# Patient Record
Sex: Female | Born: 1990 | Hispanic: No | Marital: Married | Smoking: Current every day smoker
Health system: Southern US, Community
[De-identification: ages and names within clinical notes are randomized; demographics above are authoritative.]

## PROBLEM LIST (undated history)

## (undated) DIAGNOSIS — F419 Anxiety disorder, unspecified: Secondary | ICD-10-CM

## (undated) DIAGNOSIS — F1210 Cannabis abuse, uncomplicated: Secondary | ICD-10-CM

## (undated) HISTORY — PX: NO PAST SURGERIES: SHX2092

---
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
## 2008-05-10 ENCOUNTER — Emergency Department (HOSPITAL_COMMUNITY): Admission: EM | Admit: 2008-05-10 | Discharge: 2008-05-10 | Payer: Self-pay | Admitting: Emergency Medicine

## 2008-05-12 ENCOUNTER — Emergency Department (HOSPITAL_COMMUNITY): Admission: EM | Admit: 2008-05-12 | Discharge: 2008-05-12 | Payer: Self-pay | Admitting: Emergency Medicine

## 2009-11-25 ENCOUNTER — Emergency Department: Payer: Self-pay | Admitting: Emergency Medicine

## 2010-04-13 ENCOUNTER — Emergency Department: Payer: Self-pay | Admitting: Emergency Medicine

## 2010-06-12 LAB — COMPREHENSIVE METABOLIC PANEL
ALT: 12 U/L (ref 0–35)
ALT: 13 U/L (ref 0–35)
AST: 19 U/L (ref 0–37)
AST: 20 U/L (ref 0–37)
Albumin: 4.4 g/dL (ref 3.5–5.2)
Albumin: 4.5 g/dL (ref 3.5–5.2)
Alkaline Phosphatase: 69 U/L (ref 39–117)
Alkaline Phosphatase: 77 U/L (ref 39–117)
BUN: 6 mg/dL (ref 6–23)
BUN: 6 mg/dL (ref 6–23)
CO2: 23 mEq/L (ref 19–32)
CO2: 25 mEq/L (ref 19–32)
Calcium: 9.4 mg/dL (ref 8.4–10.5)
Calcium: 9.5 mg/dL (ref 8.4–10.5)
Chloride: 104 mEq/L (ref 96–112)
Chloride: 105 mEq/L (ref 96–112)
Creatinine, Ser: 0.75 mg/dL (ref 0.4–1.2)
Creatinine, Ser: 0.79 mg/dL (ref 0.4–1.2)
GFR calc Af Amer: >60 mL/min (ref >60)
GFR calc Af Amer: >60 mL/min (ref >60)
GFR calc non Af Amer: >60 mL/min (ref >60)
GFR calc non Af Amer: >60 mL/min (ref >60)
Glucose, Bld: 96 mg/dL (ref 70–99)
Glucose, Bld: 99 mg/dL (ref 70–99)
Potassium: 3.9 mEq/L (ref 3.5–5.1)
Potassium: 3.9 mEq/L (ref 3.5–5.1)
Sodium: 137 mEq/L (ref 135–145)
Sodium: 138 mEq/L (ref 135–145)
Total Bilirubin: 0.7 mg/dL (ref 0.3–1.2)
Total Bilirubin: 0.7 mg/dL (ref 0.3–1.2)
Total Protein: 6.9 g/dL (ref 6.0–8.3)
Total Protein: 7.2 g/dL (ref 6.0–8.3)

## 2010-06-12 LAB — CBC
HCT: 40.5 % (ref 36.0–46.0)
HCT: 42 % (ref 36.0–46.0)
Hemoglobin: 13.6 g/dL (ref 12.0–15.0)
Hemoglobin: 14.1 g/dL (ref 12.0–15.0)
MCHC: 33.5 g/dL (ref 30.0–36.0)
MCHC: 33.6 g/dL (ref 30.0–36.0)
MCV: 90.9 fL (ref 78.0–100.0)
MCV: 91.2 fL (ref 78.0–100.0)
Platelets: 294 10*3/uL (ref 150–400)
Platelets: 315 10*3/uL (ref 150–400)
RBC: 4.45 MIL/uL (ref 3.87–5.11)
RBC: 4.6 MIL/uL (ref 3.87–5.11)
RDW: 13.3 % (ref 11.5–15.5)
RDW: 13.4 % (ref 11.5–15.5)
WBC: 8 10*3/uL (ref 4.0–10.5)
WBC: 9.2 10*3/uL (ref 4.0–10.5)

## 2010-06-12 LAB — URINALYSIS, ROUTINE W REFLEX MICROSCOPIC
Bilirubin Urine: NEGATIVE
Bilirubin Urine: NEGATIVE
Glucose, UA: NEGATIVE mg/dL
Glucose, UA: NEGATIVE mg/dL
Hgb urine dipstick: NEGATIVE
Hgb urine dipstick: NEGATIVE
Ketones, ur: 40 mg/dL — AB
Ketones, ur: NEGATIVE mg/dL
Nitrite: NEGATIVE
Nitrite: NEGATIVE
Protein, ur: NEGATIVE mg/dL
Protein, ur: NEGATIVE mg/dL
Specific Gravity, Urine: 1.012 (ref 1.005–1.030)
Specific Gravity, Urine: 1.021 (ref 1.005–1.030)
Urobilinogen, UA: 0.2 mg/dL (ref 0.0–1.0)
Urobilinogen, UA: 1 mg/dL (ref 0.0–1.0)
pH: 6 (ref 5.0–8.0)
pH: 7.5 (ref 5.0–8.0)

## 2010-06-12 LAB — DIFFERENTIAL
Basophils Absolute: 0 10*3/uL (ref 0.0–0.1)
Basophils Absolute: 0.1 10*3/uL (ref 0.0–0.1)
Basophils Relative: 0 % (ref 0–1)
Basophils Relative: 1 % (ref 0–1)
Eosinophils Absolute: 0 10*3/uL (ref 0.0–0.7)
Eosinophils Absolute: 0 10*3/uL (ref 0.0–0.7)
Eosinophils Relative: 0 % (ref 0–5)
Eosinophils Relative: 1 % (ref 0–5)
Lymphocytes Relative: 16 % (ref 12–46)
Lymphocytes Relative: 19 % (ref 12–46)
Lymphs Abs: 1.4 10*3/uL (ref 0.7–4.0)
Lymphs Abs: 1.5 10*3/uL (ref 0.7–4.0)
Monocytes Absolute: 0.3 10*3/uL (ref 0.1–1.0)
Monocytes Absolute: 0.3 10*3/uL (ref 0.1–1.0)
Monocytes Relative: 4 % (ref 3–12)
Monocytes Relative: 4 % (ref 3–12)
Neutro Abs: 6.1 10*3/uL (ref 1.7–7.7)
Neutro Abs: 7.4 10*3/uL (ref 1.7–7.7)
Neutrophils Relative %: 76 % (ref 43–77)
Neutrophils Relative %: 80 % — ABNORMAL HIGH (ref 43–77)

## 2010-06-12 LAB — URINE MICROSCOPIC-ADD ON

## 2010-06-12 LAB — PREGNANCY, URINE: Preg Test, Ur: NEGATIVE

## 2010-06-12 LAB — POCT PREGNANCY, URINE: Preg Test, Ur: NEGATIVE

## 2010-06-12 LAB — LIPASE, BLOOD: Lipase: 15 U/L (ref 11–59)

## 2011-02-28 ENCOUNTER — Encounter: Payer: Self-pay | Admitting: *Deleted

## 2011-02-28 ENCOUNTER — Emergency Department (HOSPITAL_COMMUNITY)
Admission: EM | Admit: 2011-02-28 | Discharge: 2011-03-01 | Disposition: A | Discharge status: Home / Self Care | Attending: Emergency Medicine | Admitting: Emergency Medicine

## 2011-02-28 DIAGNOSIS — F17200 Nicotine dependence, unspecified, uncomplicated: Secondary | ICD-10-CM | POA: Insufficient documentation

## 2011-02-28 DIAGNOSIS — R1033 Periumbilical pain: Secondary | ICD-10-CM | POA: Insufficient documentation

## 2011-02-28 DIAGNOSIS — R197 Diarrhea, unspecified: Secondary | ICD-10-CM | POA: Insufficient documentation

## 2011-02-28 DIAGNOSIS — R112 Nausea with vomiting, unspecified: Secondary | ICD-10-CM | POA: Insufficient documentation

## 2011-02-28 LAB — CBC
HCT: 36.4 % (ref 36.0–46.0)
Hemoglobin: 12.4 g/dL (ref 12.0–15.0)
MCH: 30.5 pg (ref 26.0–34.0)
MCHC: 34.1 g/dL (ref 30.0–36.0)
MCV: 89.4 fL (ref 78.0–100.0)
Platelets: 204 10*3/uL (ref 150–400)
RBC: 4.07 MIL/uL (ref 3.87–5.11)
RDW: 13.4 % (ref 11.5–15.5)
WBC: 6.6 10*3/uL (ref 4.0–10.5)

## 2011-02-28 LAB — DIFFERENTIAL
Basophils Relative: 0 % (ref 0–1)
Eosinophils Absolute: 0 10*3/uL (ref 0.0–0.7)
Eosinophils Relative: 0 % (ref 0–5)
Lymphocytes Relative: 11 % — ABNORMAL LOW (ref 12–46)
Lymphs Abs: 0.8 10*3/uL (ref 0.7–4.0)
Monocytes Absolute: 0.4 10*3/uL (ref 0.1–1.0)
Monocytes Relative: 6 % (ref 3–12)
Neutro Abs: 5.4 10*3/uL (ref 1.7–7.7)
Neutrophils Relative %: 82 % — ABNORMAL HIGH (ref 43–77)

## 2011-02-28 MED ORDER — MORPHINE SULFATE 4 MG/ML IJ SOLN
4.0000 mg | Freq: Once | INTRAMUSCULAR | Status: AC
  Administered 2011-02-28: 4 mg via INTRAVENOUS
  Filled 2011-02-28: qty 1

## 2011-02-28 MED ORDER — SODIUM CHLORIDE 0.9 % IV BOLUS (SEPSIS)
1000.0000 mL | Freq: Once | INTRAVENOUS | Status: AC
  Administered 2011-02-28: 1000 mL via INTRAVENOUS

## 2011-02-28 MED ORDER — ONDANSETRON HCL 4 MG/2ML IJ SOLN
4.0000 mg | Freq: Once | INTRAMUSCULAR | Status: AC
  Administered 2011-02-28: 4 mg via INTRAVENOUS
  Filled 2011-02-28: qty 2

## 2011-02-28 NOTE — ED Provider Notes (Signed)
History    this is a 20 year old female presenting to the ED with chief complaint of abdominal pain. Since last night patient has been having pain to the periumbilical region. Patient describes pain as sharp, shooting, throbbing, waxing and waning with associated nausea vomiting and diarrhea. Pain is rated as a 10 out of 10. She denies fever but having chills, or loss of appetite. Patient does not recall her last menstrual. She has been using implanon. She has implanon removed yesterday.  She denies dysuria or blood per urine.    CSN: [id number]  Arrival date & time 02/28/11  2217   First MD Initiated Contact with Patient 02/28/11 2249      Chief Complaint  Patient presents with  . Abdominal Pain    (Consider location/radiation/quality/duration/timing/severity/associated sxs/prior treatment) HPI  History reviewed. No pertinent past medical history.  History reviewed. No pertinent past surgical history.  History reviewed. No pertinent family history.  History  Substance Use Topics  . Smoking status: Current Everyday Smoker  . Smokeless tobacco: Not on file  . Alcohol Use: Yes    OB History    Grav Para Term Preterm Abortions TAB SAB Ect Mult Living                  Review of Systems  All other systems reviewed and are negative.    Allergies  Review of patient's allergies indicates no known allergies.  Home Medications   Current Outpatient Rx  Name Route Sig Dispense Refill  . CLONAZEPAM 0.5 MG PO TABS Oral Take 0.5 mg by mouth 2 (two) times daily as needed. For anxiety       BP 128/78  Pulse 100  Temp(Src) 98.4 F (36.9 C) (Oral)  Resp 18  SpO2 96%  LMP 02/25/2011  Physical Exam  Nursing note and vitals reviewed. Constitutional: She appears well-developed and well-nourished. No distress.  HENT:  Head: Normocephalic and atraumatic.  Eyes: Conjunctivae are normal.  Neck: Normal range of motion. Neck supple.  Cardiovascular: Normal rate and regular  rhythm.   Pulmonary/Chest: Effort normal and breath sounds normal. She exhibits no tenderness.  Abdominal: Soft. There is tenderness in the periumbilical area. There is no rigidity, no guarding, no CVA tenderness, no tenderness at McBurney's point and negative Murphy's sign. No hernia. Hernia confirmed negative in the ventral area, confirmed negative in the right inguinal area and confirmed negative in the left inguinal area.  Genitourinary: Uterus normal. There is no rash or lesion on the right labia. There is no rash or lesion on the left labia. Cervix exhibits no motion tenderness and no discharge. Right adnexum displays no mass and no tenderness. Left adnexum displays no mass and no tenderness. There is bleeding around the vagina. No erythema or tenderness around the vagina. No vaginal discharge found.  Lymphadenopathy:       Right: No inguinal adenopathy present.       Left: No inguinal adenopathy present.    ED Course  Procedures (including critical care time)  Labs Reviewed - No data to display No results found.   No diagnosis found.    MDM  Periumbilical Abdominal pain with nausea vomiting and diarrhea. Patient also complaining of loss of appetite. With her presenting symptoms I will obtain an abdominal CT scan to rule out appendicitis. However she may likely having flulike symptoms. A pelvic examination was unremarkable. Dr. Graciela Husbands has agreed to continue monitoring patient and will discharge patient as appropriate.  Medical screening examination/treatment/procedure(s) were performed by  non-physician practitioner and as supervising physician I was immediately available for consultation/collaboration. Osvaldo Human, M.D.        Fayrene Helper, PA 03/01/11 4098  Carleene Cooper III, MD 03/01/11 [phone]

## 2011-02-28 NOTE — ED Notes (Signed)
Generalized abd pain tonight with vomiting and diarrhea.  lmp one week ago

## 2011-03-01 ENCOUNTER — Emergency Department (HOSPITAL_COMMUNITY)

## 2011-03-01 ENCOUNTER — Encounter (HOSPITAL_COMMUNITY): Payer: Self-pay | Admitting: Radiology

## 2011-03-01 LAB — POCT I-STAT, CHEM 8
BUN: 9 mg/dL (ref 6–23)
Chloride: 107 mEq/L (ref 96–112)
Potassium: 3.7 mEq/L (ref 3.5–5.1)
Sodium: 140 mEq/L (ref 135–145)

## 2011-03-01 LAB — URINALYSIS, ROUTINE W REFLEX MICROSCOPIC
Ketones, ur: 15 mg/dL — AB
Leukocytes, UA: NEGATIVE
Nitrite: NEGATIVE
Protein, ur: NEGATIVE mg/dL
pH: 5.5 (ref 5.0–8.0)

## 2011-03-01 LAB — HEPATIC FUNCTION PANEL
Alkaline Phosphatase: 51 U/L (ref 39–117)
Total Bilirubin: 0.2 mg/dL — ABNORMAL LOW (ref 0.3–1.2)
Total Protein: 6.3 g/dL (ref 6.0–8.3)

## 2011-03-01 LAB — PREGNANCY, URINE: Preg Test, Ur: NEGATIVE

## 2011-03-01 MED ORDER — MORPHINE SULFATE 4 MG/ML IJ SOLN
4.0000 mg | Freq: Once | INTRAMUSCULAR | Status: AC
  Administered 2011-03-01: 4 mg via INTRAVENOUS
  Filled 2011-03-01: qty 1

## 2011-03-01 MED ORDER — IOHEXOL 300 MG/ML  SOLN
80.0000 mL | Freq: Once | INTRAMUSCULAR | Status: AC | PRN
  Administered 2011-03-01: 80 mL via INTRAVENOUS

## 2011-03-01 MED ORDER — ONDANSETRON HCL 4 MG/2ML IJ SOLN
4.0000 mg | Freq: Once | INTRAMUSCULAR | Status: AC
  Administered 2011-03-01: 4 mg via INTRAVENOUS

## 2011-03-01 MED ORDER — ONDANSETRON HCL 4 MG/2ML IJ SOLN
INTRAMUSCULAR | Status: AC
  Administered 2011-03-01: 4 mg via INTRAVENOUS
  Filled 2011-03-01: qty 2

## 2011-03-01 MED ORDER — IOHEXOL 300 MG/ML  SOLN: 20.0000 mL | Freq: Once | INTRAMUSCULAR | Status: AC | PRN

## 2011-03-01 MED ORDER — ONDANSETRON 8 MG PO TBDP: 8.0000 mg | ORAL_TABLET | Freq: Three times a day (TID) | ORAL | Status: AC | PRN

## 2011-03-01 MED ORDER — OXYCODONE-ACETAMINOPHEN 5-325 MG PO TABS: 1.0000 | ORAL_TABLET | ORAL | Status: AC | PRN

## 2011-03-01 NOTE — ED Provider Notes (Signed)
Ct negative Labs reviewed abd soft and no tenderness in RLQ  Doubt occult appendicitis or other acute abd process Stable for d/c BP 116/59  Pulse 78  Temp(Src) 99.2 F (37.3 C) (Oral)  Resp 16  SpO2 98%  LMP 02/25/2011   Joya Gaskins, MD 03/01/11 [phone]

## 2011-03-02 ENCOUNTER — Encounter (HOSPITAL_COMMUNITY): Payer: Self-pay | Admitting: Emergency Medicine

## 2011-05-08 ENCOUNTER — Encounter (HOSPITAL_COMMUNITY): Payer: Self-pay | Admitting: *Deleted

## 2011-05-08 ENCOUNTER — Emergency Department (HOSPITAL_COMMUNITY)
Admission: EM | Admit: 2011-05-08 | Discharge: 2011-05-08 | Disposition: A | Discharge status: Home / Self Care | Attending: Emergency Medicine | Admitting: Emergency Medicine

## 2011-05-08 DIAGNOSIS — R51 Headache: Secondary | ICD-10-CM | POA: Insufficient documentation

## 2011-05-08 DIAGNOSIS — R112 Nausea with vomiting, unspecified: Secondary | ICD-10-CM | POA: Insufficient documentation

## 2011-05-08 DIAGNOSIS — R1110 Vomiting, unspecified: Secondary | ICD-10-CM

## 2011-05-08 DIAGNOSIS — F17200 Nicotine dependence, unspecified, uncomplicated: Secondary | ICD-10-CM | POA: Insufficient documentation

## 2011-05-08 LAB — POCT I-STAT, CHEM 8
BUN: 11 mg/dL (ref 6–23)
Chloride: 108 mEq/L (ref 96–112)
Sodium: 141 mEq/L (ref 135–145)

## 2011-05-08 LAB — HCG, SERUM, QUALITATIVE: Preg, Serum: NEGATIVE

## 2011-05-08 MED ORDER — ONDANSETRON HCL 4 MG PO TABS: 4.0000 mg | ORAL_TABLET | Freq: Four times a day (QID) | ORAL | Status: AC

## 2011-05-08 MED ORDER — SODIUM CHLORIDE 0.9 % IV BOLUS (SEPSIS)
1000.0000 mL | Freq: Once | INTRAVENOUS | Status: AC
  Administered 2011-05-08: 1000 mL via INTRAVENOUS

## 2011-05-08 MED ORDER — LORAZEPAM 2 MG/ML IJ SOLN
1.0000 mg | Freq: Once | INTRAMUSCULAR | Status: AC
  Administered 2011-05-08: 1 mg via INTRAVENOUS
  Filled 2011-05-08: qty 1

## 2011-05-08 MED ORDER — ONDANSETRON HCL 4 MG/2ML IJ SOLN
4.0000 mg | Freq: Once | INTRAMUSCULAR | Status: AC
  Administered 2011-05-08: 4 mg via INTRAVENOUS
  Filled 2011-05-08: qty 2

## 2011-05-08 MED ORDER — DROPERIDOL 2.5 MG/ML IJ SOLN
2.5000 mg | Freq: Once | INTRAMUSCULAR | Status: AC
  Administered 2011-05-08: 2.5 mg via INTRAVENOUS
  Filled 2011-05-08: qty 1

## 2011-05-08 NOTE — ED Provider Notes (Signed)
I saw and evaluated the patient, reviewed the resident's note and I agree with the findings and plan.   .Face to face Exam:  General:  Awake HEENT:  Atraumatic Resp:  Normal effort Abd:  Nondistended Neuro:No focal weakness Lymph: No adenopathy   Nelia Shi, MD 05/08/11 1840

## 2011-05-08 NOTE — ED Provider Notes (Signed)
Care assumed from Dr. Radford Pax at shift change.  Patient feeling better after ivf's.  Labs look okay.  Will discharge with pain meds.   Geoffery Lyons, MD 05/08/11 [phone]
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
## 2011-05-08 NOTE — ED Provider Notes (Signed)
History     CSN: [id number]  Arrival date & time 05/08/11  1252   First MD Initiated Contact with Patient 05/08/11 1333      Chief Complaint  Patient presents with  . Nausea  . Emesis    (Consider location/radiation/quality/duration/timing/severity/associated sxs/prior Treatment)  HPI Patient is 21 yo lady without significant PMH, who presents with nausea and vomiting. Per patient, she drank proximately 6 shots of liquor alcohol in yesterday evening and took small amount of cocaine (about 0.25 gram per patient). After that she could not go to sleep.  Her friend give her a Subsoxone strip to help her go to sleep. She took one quarter of the Suboxone strip sublingually. 2 hours later, she started having severe nausea and vomiting. She vomited more than 15 times since last night. There is no blood in the vomitus. She also has mild headache, hand shaking, itch on her arms and face. She denies fever, chills, abdominal pain, diarrhea, constipation.   She reports that she has not been sexually active for past 4 months. Her last menstrual period was in the mid of last month.    Denies cough, chest pain, SOB, dysuria, urgency, frequency, hematuria, joint pain or leg swelling.  History  Substance Use Topics  . Smoking status: Current Everyday Smoker  . Smokeless tobacco: Not on file  . Alcohol Use: Yes    OB History    Grav Para Term Preterm Abortions TAB SAB Ect Mult Living                  Review of Systems  Constitutional: Negative for fever, chills and diaphoresis.  HENT: Negative for ear pain, congestion, sore throat, facial swelling, sneezing, mouth sores, trouble swallowing, neck pain, tinnitus and ear discharge.   Eyes: Negative for photophobia, pain, discharge and visual disturbance.  Respiratory: Negative for cough, choking, chest tightness, shortness of breath, wheezing and stridor.   Cardiovascular: Negative for chest pain, palpitations and leg swelling.  Gastrointestinal:  Positive for nausea and vomiting. Negative for abdominal pain, diarrhea, constipation, blood in stool, abdominal distention, anal bleeding and rectal pain.  Genitourinary: Negative for dysuria, urgency, frequency, hematuria, decreased urine volume, vaginal bleeding, vaginal discharge and menstrual problem.  Musculoskeletal: Negative for myalgias, back pain, joint swelling and gait problem.  Skin: Negative for rash and wound.  Neurological: Positive for headaches. Negative for dizziness, seizures, syncope, facial asymmetry, speech difficulty, weakness, light-headedness and numbness.       Patient has hand shaking.   Hematological: Does not bruise/bleed easily.  Psychiatric/Behavioral: Negative for hallucinations, confusion and agitation.    Allergies  Review of patient's allergies indicates no known allergies.  Home Medications   Current Outpatient Rx  Name Route Sig Dispense Refill  . CLONAZEPAM 0.5 MG PO TABS Oral Take 0.5 mg by mouth 2 (two) times daily as needed. For anxiety       BP 137/75  Pulse 70  Temp(Src) 97.6 F (36.4 C) (Oral)  Resp 20  SpO2 96%  Physical Exam  General: not in acute distress HEENT: PERRL, EOMI, no scleral icterus Cardiac: S1/S2, RRR, No murmurs, gallops or rubs Pulm: Good air movement bilaterally, Clear to auscultation bilaterally, No rales, wheezing, rhonchi or rubs. Abd: Soft,  nondistended, nontender, no rebound pain, no organomegaly, BS present Ext: No rashes or edema, 2+DP/PT pulse bilaterally. Has fine tremor in hands bilaterally. Neuro: alert and oriented X3, cranial nerves II-XII grossly intact, muscle strength 5/5 in all extremeties,  sensation to light touch  intact.   ED Course  Procedures (including critical care time)  IV fluid, NL 1000 ml Zofran Stat Chem 8 hCG, serum, qualitative Ativan   Labs Reviewed  HCG, SERUM, QUALITATIVE   No results found.   No diagnosis found.    MDM          Lorretta Harp, MD 05/08/11  [phone]

## 2011-05-08 NOTE — Discharge Instructions (Signed)
Nausea and Vomiting  Nausea is a sick feeling that often comes before throwing up (vomiting). Vomiting is a reflex where stomach contents come out of your mouth. Vomiting can cause severe loss of body fluids (dehydration). Children and elderly adults can become dehydrated quickly, especially if they also have diarrhea. Nausea and vomiting are symptoms of a condition or disease. It is important to find the cause of your symptoms.  CAUSES    Direct irritation of the stomach lining. This irritation can result from increased acid production (gastroesophageal reflux disease), infection, food poisoning, taking certain medicines (such as nonsteroidal anti-inflammatory drugs), alcohol use, or tobacco use.   Signals from the brain.These signals could be caused by a headache, heat exposure, an inner ear disturbance, increased pressure in the brain from injury, infection, a tumor, or a concussion, pain, emotional stimulus, or metabolic problems.   An obstruction in the gastrointestinal tract (bowel obstruction).   Illnesses such as diabetes, hepatitis, gallbladder problems, appendicitis, kidney problems, cancer, sepsis, atypical symptoms of a heart attack, or eating disorders.   Medical treatments such as chemotherapy and radiation.   Receiving medicine that makes you sleep (general anesthetic) during surgery.  DIAGNOSIS  Your caregiver may ask for tests to be done if the problems do not improve after a few days. Tests may also be done if symptoms are severe or if the reason for the nausea and vomiting is not clear. Tests may include:   Urine tests.   Blood tests.   Stool tests.   Cultures (to look for evidence of infection).   X-rays or other imaging studies.  Test results can help your caregiver make decisions about treatment or the need for additional tests.  TREATMENT  You need to stay well hydrated. Drink frequently but in small amounts.You may wish to drink water, sports drinks, clear broth, or eat frozen  ice pops or gelatin dessert to help stay hydrated.When you eat, eating slowly may help prevent nausea.There are also some antinausea medicines that may help prevent nausea.  HOME CARE INSTRUCTIONS    Take all medicine as directed by your caregiver.   If you do not have an appetite, do not force yourself to eat. However, you must continue to drink fluids.   If you have an appetite, eat a normal diet unless your caregiver tells you differently.   Eat a variety of complex carbohydrates (rice, wheat, potatoes, bread), lean meats, yogurt, fruits, and vegetables.   Avoid high-fat foods because they are more difficult to digest.   Drink enough water and fluids to keep your urine clear or pale yellow.   If you are dehydrated, ask your caregiver for specific rehydration instructions. Signs of dehydration may include:   Severe thirst.   Dry lips and mouth.   Dizziness.   Dark urine.   Decreasing urine frequency and amount.   Confusion.   Rapid breathing or pulse.  SEEK IMMEDIATE MEDICAL CARE IF:    You have blood or brown flecks (like coffee grounds) in your vomit.   You have black or bloody stools.   You have a severe headache or stiff neck.   You are confused.   You have severe abdominal pain.   You have chest pain or trouble breathing.   You do not urinate at least once every 8 hours.   You develop cold or clammy skin.   You continue to vomit for longer than 24 to 48 hours.   You have a fever.  MAKE SURE YOU:      Understand these instructions.   Will watch your condition.   Will get help right away if you are not doing well or get worse.  Document Released: 02/16/2005 Document Revised: 02/05/2011 Document Reviewed: 07/16/2010  [employer] Patient Information 2012 [employer].

## 2011-07-11 ENCOUNTER — Emergency Department (HOSPITAL_COMMUNITY)

## 2011-07-11 ENCOUNTER — Emergency Department (HOSPITAL_COMMUNITY)
Admission: EM | Admit: 2011-07-11 | Discharge: 2011-07-11 | Disposition: A | Discharge status: Home / Self Care | Attending: Emergency Medicine | Admitting: Emergency Medicine

## 2011-07-11 DIAGNOSIS — R079 Chest pain, unspecified: Secondary | ICD-10-CM | POA: Insufficient documentation

## 2011-07-11 DIAGNOSIS — M545 Low back pain, unspecified: Secondary | ICD-10-CM | POA: Insufficient documentation

## 2011-07-11 DIAGNOSIS — IMO0002 Reserved for concepts with insufficient information to code with codable children: Secondary | ICD-10-CM

## 2011-07-11 DIAGNOSIS — R0602 Shortness of breath: Secondary | ICD-10-CM | POA: Insufficient documentation

## 2011-07-11 DIAGNOSIS — M25559 Pain in unspecified hip: Secondary | ICD-10-CM | POA: Insufficient documentation

## 2011-07-11 DIAGNOSIS — R109 Unspecified abdominal pain: Secondary | ICD-10-CM | POA: Insufficient documentation

## 2011-07-11 LAB — URINALYSIS, MICROSCOPIC ONLY
Nitrite: NEGATIVE
Specific Gravity, Urine: 1.019 (ref 1.005–1.030)
pH: 5.5 (ref 5.0–8.0)

## 2011-07-11 LAB — RAPID URINE DRUG SCREEN, HOSP PERFORMED
Amphetamines: NOT DETECTED
Barbiturates: NOT DETECTED
Benzodiazepines: POSITIVE — AB
Cocaine: NOT DETECTED
Opiates: POSITIVE — AB
Tetrahydrocannabinol: POSITIVE — AB

## 2011-07-11 LAB — POCT I-STAT, CHEM 8
BUN: 19 mg/dL (ref 6–23)
Chloride: 105 mEq/L (ref 96–112)
Creatinine, Ser: 0.8 mg/dL (ref 0.50–1.10)
Glucose, Bld: 119 mg/dL — ABNORMAL HIGH (ref 70–99)
HCT: 38 % (ref 36.0–46.0)
Potassium: 3.7 mEq/L (ref 3.5–5.1)

## 2011-07-11 LAB — COMPREHENSIVE METABOLIC PANEL WITH GFR
ALT: 11 U/L (ref 0–35)
AST: 21 U/L (ref 0–37)
Albumin: 3.6 g/dL (ref 3.5–5.2)
Alkaline Phosphatase: 56 U/L (ref 39–117)
BUN: 16 mg/dL (ref 6–23)
CO2: 20 meq/L (ref 19–32)
Calcium: 8.3 mg/dL — ABNORMAL LOW (ref 8.4–10.5)
Chloride: 102 meq/L (ref 96–112)
Creatinine, Ser: 0.76 mg/dL (ref 0.50–1.10)
GFR calc Af Amer: >90 mL/min
GFR calc non Af Amer: >90 mL/min
Glucose, Bld: 116 mg/dL — ABNORMAL HIGH (ref 70–99)
Potassium: 3.3 meq/L — ABNORMAL LOW (ref 3.5–5.1)
Sodium: 135 meq/L (ref 135–145)
Total Bilirubin: 0.1 mg/dL — ABNORMAL LOW (ref 0.3–1.2)
Total Protein: 6.1 g/dL (ref 6.0–8.3)

## 2011-07-11 LAB — CBC
Hemoglobin: 12.7 g/dL (ref 12.0–15.0)
Platelets: 112 10*3/uL — ABNORMAL LOW (ref 150–400)
RBC: 4.05 MIL/uL (ref 3.87–5.11)
WBC: 12.5 10*3/uL — ABNORMAL HIGH (ref 4.0–10.5)

## 2011-07-11 LAB — SAMPLE TO BLOOD BANK

## 2011-07-11 LAB — ETHANOL: Alcohol, Ethyl (B): <11 mg/dL (ref 0–11)

## 2011-07-11 LAB — LACTIC ACID, PLASMA: Lactic Acid, Venous: 1.6 mmol/L (ref 0.5–2.2)

## 2011-07-11 MED ORDER — ONDANSETRON 4 MG PO TBDP: 4.0000 mg | ORAL_TABLET | Freq: Three times a day (TID) | ORAL | Status: AC | PRN

## 2011-07-11 MED ORDER — ONDANSETRON HCL 4 MG/2ML IJ SOLN
INTRAMUSCULAR | Status: AC
  Administered 2011-07-11: 4 mg
  Filled 2011-07-11: qty 2

## 2011-07-11 MED ORDER — OXYCODONE-ACETAMINOPHEN 7.5-325 MG PO TABS: 1.0000 | ORAL_TABLET | ORAL | Status: AC | PRN

## 2011-07-11 MED ORDER — HYDROMORPHONE HCL PF 1 MG/ML IJ SOLN
1.0000 mg | Freq: Once | INTRAMUSCULAR | Status: AC
  Administered 2011-07-11: 1 mg via INTRAVENOUS
  Filled 2011-07-11: qty 1

## 2011-07-11 MED ORDER — TETANUS-DIPHTHERIA TOXOIDS TD 5-2 LFU IM INJ
0.5000 mL | INJECTION | Freq: Once | INTRAMUSCULAR | Status: AC
  Administered 2011-07-11: 0.5 mL via INTRAMUSCULAR
  Filled 2011-07-11: qty 0.5

## 2011-07-11 MED ORDER — MORPHINE SULFATE 4 MG/ML IJ SOLN
INTRAMUSCULAR | Status: AC
  Administered 2011-07-11: 03:00:00
  Filled 2011-07-11: qty 1

## 2011-07-11 MED ORDER — HYDROMORPHONE HCL PF 1 MG/ML IJ SOLN
1.0000 mg | Freq: Once | INTRAMUSCULAR | Status: AC
  Administered 2011-07-11: 1 mg via INTRAVENOUS
  Administered 2011-07-11: 0.5 mg via INTRAVENOUS
  Filled 2011-07-11: qty 1

## 2011-07-11 MED ORDER — HYDROMORPHONE HCL PF 1 MG/ML IJ SOLN
INTRAMUSCULAR | Status: AC
  Administered 2011-07-11: 1 mg via INTRAVENOUS
  Filled 2011-07-11: qty 1

## 2011-07-11 MED ORDER — MORPHINE SULFATE 2 MG/ML IJ SOLN
INTRAMUSCULAR | Status: AC | PRN
  Administered 2011-07-11: 6 mg via INTRAVENOUS
  Administered 2011-07-11 (×2): 2 mg via INTRAVENOUS

## 2011-07-11 MED ORDER — HYDROMORPHONE HCL PF 1 MG/ML IJ SOLN
1.0000 mg | Freq: Once | INTRAMUSCULAR | Status: AC
  Administered 2011-07-11: 0.5 mg via INTRAVENOUS
  Administered 2011-07-11: 1 mg via INTRAVENOUS
  Filled 2011-07-11: qty 1

## 2011-07-11 MED ORDER — CEFAZOLIN SODIUM 1-5 GM-% IV SOLN
1.0000 g | Freq: Once | INTRAVENOUS | Status: AC
  Administered 2011-07-11: 1 g via INTRAVENOUS
  Filled 2011-07-11: qty 50

## 2011-07-11 MED ORDER — HYDROMORPHONE HCL PF 1 MG/ML IJ SOLN
INTRAMUSCULAR | Status: AC
  Administered 2011-07-11: 0.5 mg via INTRAVENOUS
  Filled 2011-07-11: qty 1

## 2011-07-11 MED ORDER — MORPHINE SULFATE 2 MG/ML IJ SOLN
INTRAMUSCULAR | Status: AC
  Administered 2011-07-11: 2 mg via INTRAVENOUS
  Filled 2011-07-11: qty 1

## 2011-07-11 MED ORDER — IOHEXOL 300 MG/ML  SOLN
100.0000 mL | Freq: Once | INTRAMUSCULAR | Status: AC | PRN
  Administered 2011-07-11: 100 mL via INTRAVENOUS

## 2011-07-11 MED ORDER — DIAZEPAM 5 MG/ML IJ SOLN
2.5000 mg | Freq: Once | INTRAMUSCULAR | Status: AC
  Administered 2011-07-11: 2.5 mg via INTRAVENOUS
  Filled 2011-07-11: qty 2

## 2011-07-11 NOTE — ED Notes (Signed)
PT TRANSPORTED TO XRAY 

## 2011-07-11 NOTE — ED Notes (Signed)
PT. RESTING , SUTURE CART SET UP AT BEDSIDE , IV SITE AND FOLEY CATHETER INTACT.

## 2011-07-11 NOTE — ED Notes (Signed)
DR. Norlene Campbell AT BEDSIDE SPEAKING WITH PT. AND FAMILY.
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
## 2011-07-11 NOTE — ED Notes (Signed)
DR. Tollie Eth WITH FATHER AND MOTHER.
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
## 2011-07-11 NOTE — ED Notes (Signed)
PT. CURRENTLY AT CT SCAN . FAMILY ARRIVED - CHAPLAIN WITH FAMILY .

## 2011-07-11 NOTE — ED Notes (Signed)
C-COLLAR REMOVED BY DR. Norlene Campbell.  ICE CHIPS GIVEN TO PT.

## 2011-07-11 NOTE — ED Notes (Signed)
FR.14 FOLEY CATHETER INSERTED BY NT.

## 2011-07-11 NOTE — Discharge Instructions (Signed)
Laceration Care, Adult A laceration is a cut that goes through all layers of the skin. The cut goes into the tissue beneath the skin. HOME CARE For stitches (sutures) or staples:  Keep the cut clean and dry.   If you have a bandage (dressing), change it at least once a day. Change the bandage if it gets wet or dirty, or as told by your doctor.   Wash the cut with soap and water 2 times a day. Rinse the cut with water. Pat it dry with a clean towel.   Put a thin layer of medicated cream on the cut as told by your doctor.   You may shower after the first 24 hours. Do not soak the cut in water until the stitches are removed.   Only take medicines as told by your doctor.   Have your stitches or staples removed as told by your doctor.  For skin adhesive strips:  Keep the cut clean and dry.   Do not get the strips wet. You may take a bath, but be careful to keep the cut dry.   If the cut gets wet, pat it dry with a clean towel.   The strips will fall off on their own. Do not remove the strips that are still stuck to the cut.  For wound glue:  You may shower or take baths. Do not soak or scrub the cut. Do not swim. Avoid heavy sweating until the glue falls off on its own. After a shower or bath, pat the cut dry with a clean towel.   Do not put medicine on your cut until the glue falls off.   If you have a bandage, do not put tape over the glue.   Avoid lots of sunlight or tanning lamps until the glue falls off. Put sunscreen on the cut for the first year to reduce your scar.   The glue will fall off on its own. Do not pick at the glue.  You may need a tetanus shot if:  You cannot remember when you had your last tetanus shot.   You have never had a tetanus shot.  If you need a tetanus shot and you choose not to have one, you may get tetanus. Sickness from tetanus can be serious. GET HELP RIGHT AWAY IF:   Your pain does not get better with medicine.   Your arm, hand, leg, or  foot loses feeling (numbness) or changes color.   Your cut is bleeding.   Your joint feels weak, or you cannot use your joint.   You have painful lumps on your body.   Your cut is red, puffy (swollen), or painful.   You have a red line on the skin near the cut.   You have yellowish-white fluid (pus) coming from the cut.   You have a fever.   You have a bad smell coming from the cut or bandage.   Your cut breaks open before or after stitches are removed.   You notice something coming out of the cut, such as wood or glass.   You cannot move a finger or toe.  MAKE SURE YOU:   Understand these instructions.   Will watch your condition.   Will get help right away if you are not doing well or get worse.  Document Released: 08/05/2007 Document Revised: 02/05/2011 Document Reviewed: 08/12/2010 [employer] Patient Information 2012 [employer], [facility]    Contusion A contusion is a deep bruise. Contusions are the result  of an injury that caused bleeding under the skin. The contusion may turn blue, purple, or yellow. Minor injuries will give you a painless contusion, but more severe contusions may stay painful and swollen for a few weeks.  CAUSES  A contusion is usually caused by a blow, trauma, or direct force to an area of the body. SYMPTOMS   Swelling and redness of the injured area.   Bruising of the injured area.   Tenderness and soreness of the injured area.   Pain.  DIAGNOSIS  The diagnosis can be made by taking a history and physical exam. An X-ray, CT scan, or MRI may be needed to determine if there were any associated injuries, such as fractures. TREATMENT  Specific treatment will depend on what area of the body was injured. In general, the best treatment for a contusion is resting, icing, elevating, and applying cold compresses to the injured area. Over-the-counter medicines may also be recommended for pain control. Ask your caregiver what the best treatment is for your  contusion. HOME CARE INSTRUCTIONS   Put ice on the injured area.   Put ice in a plastic bag.   Place a towel between your skin and the bag.   Leave the ice on for 15 to 20 minutes, 3 to 4 times a day.   Only take over-the-counter or prescription medicines for pain, discomfort, or fever as directed by your caregiver. Your caregiver may recommend avoiding anti-inflammatory medicines (aspirin, ibuprofen, and naproxen) for 48 hours because these medicines may increase bruising.   Rest the injured area.   If possible, elevate the injured area to reduce swelling.  SEEK IMMEDIATE MEDICAL CARE IF:   You have increased bruising or swelling.   You have pain that is getting worse.   Your swelling or pain is not relieved with medicines.  MAKE SURE YOU:   Understand these instructions.   Will watch your condition.   Will get help right away if you are not doing well or get worse.  Document Released: 11/26/2004 Document Revised: 02/05/2011 Document Reviewed: 12/22/2010 [facility] Patient Information 2012 [place], [place].Crutch Use You have been prescribed crutches to take weight off one of your lower legs or feet (extremities). When using crutches, make sure you are not putting pressure on the armpit (axilla). This could cause damage to the nerves that extend from your axilla to the hand and arm. When fitted properly the crutches should be 2 to 3 finger widths below the axilla. Your weight should be supported by your hand, and not by resting upon the crutch with the axilla. When walking, first step with the crutches, then swing the healthy leg through and slightly ahead. When going up stairs, first step up with the healthy leg and then follow with the crutches and injured leg up to the same step, and so forth. If there is a handrail, hold both crutches in one hand, place your other hand on the handrail, and while placing your weight on your arms, lift your good leg to the step, then bring the  crutches and the injured leg up to that step. Repeat for each step. When going down stairs, first step with the injured leg and crutches, following down with the healthy leg to the same step. Be very careful, as going down stairs with crutches is very challenging. If you feel wobbly or nervous, sit down and inch yourself down the stairs on your butt. To get up from a chair, hold injured leg forward, grab armrest  with one hand and the top of the crutches with the other hand. Using these supports, pull yourself up to a standing position. Reverse this procedure for sitting. See your caregiver for follow up as suggested. If you are discharged in an ace wrap and develop numbness, tingling, swelling, or increased pain, loosen the ace wrap and re-wrap looser. If these problems persist, see your caregiver as needed. If you have been instructed to use partial weight bearing, bear (apply) the amount of weight as suggested by your caregiver. Do not bear weight in an amount that causes pain on the area of injury. Document Released: 02/14/2000 Document Revised: 02/05/2011 Document Reviewed: 04/23/2008 [facility] Patient Information 2012 [place], [place].Marland Kitchen

## 2011-07-11 NOTE — ED Provider Notes (Signed)
LACERATION REPAIR Performed by: Nelia Shi Authorized by: Nelia Shi Consent: Verbal consent obtained. Risks and benefits: risks, benefits and alternatives were discussed Consent given by: patient Patient identity confirmed: provided demographic data Prepped and Draped in normal sterile fashion Wound explored  Laceration Location: R hip   Laceration Length: 8cm  No Foreign Bodies seen or palpated  Anesthesia: local infiltration  Local anesthetic: lidocaine 2% with epinephrine  Anesthetic total: 15 ml  Irrigation method: syringe Amount of cleaning:extensive   Skin closure:primary  Number of sutures: 16  Technique: interupted   Patient tolerance: Patient tolerated the procedure well with no immediate complications.   Nelia Shi, MD 07/11/11 1147

## 2011-07-11 NOTE — ED Notes (Signed)
X-ray at bedside

## 2011-07-11 NOTE — ED Notes (Signed)
PT. RECEIVED MORPHINE 6 MG , DUPLICATE DOCUMENTATION BY TRAUMA /SCRIBE NURSE.

## 2011-07-11 NOTE — ED Notes (Signed)
Patient being log-rolled off spine board; C-spine precautions maintained.

## 2011-11-20 ENCOUNTER — Encounter (HOSPITAL_COMMUNITY): Payer: Self-pay | Admitting: Emergency Medicine

## 2011-11-20 ENCOUNTER — Emergency Department (HOSPITAL_COMMUNITY)
Admission: EM | Admit: 2011-11-20 | Discharge: 2011-11-20 | Disposition: A | Discharge status: Home / Self Care | Attending: Emergency Medicine | Admitting: Emergency Medicine

## 2011-11-20 DIAGNOSIS — R599 Enlarged lymph nodes, unspecified: Secondary | ICD-10-CM | POA: Insufficient documentation

## 2011-11-20 DIAGNOSIS — J069 Acute upper respiratory infection, unspecified: Secondary | ICD-10-CM | POA: Insufficient documentation

## 2011-11-20 DIAGNOSIS — K047 Periapical abscess without sinus: Secondary | ICD-10-CM

## 2011-11-20 DIAGNOSIS — K044 Acute apical periodontitis of pulpal origin: Secondary | ICD-10-CM | POA: Insufficient documentation

## 2011-11-20 HISTORY — DX: Anxiety disorder, unspecified: F41.9

## 2011-11-20 LAB — CBC WITH DIFFERENTIAL/PLATELET
Basophils Absolute: 0.1 10*3/uL (ref 0.0–0.1)
Eosinophils Relative: 1 % (ref 0–5)
Lymphs Abs: 1.6 10*3/uL (ref 0.7–4.0)
MCH: 29.8 pg (ref 26.0–34.0)
MCV: 87.8 fL (ref 78.0–100.0)
Monocytes Absolute: 0.6 10*3/uL (ref 0.1–1.0)
Platelets: 271 10*3/uL (ref 150–400)
RDW: 12.2 % (ref 11.5–15.5)

## 2011-11-20 LAB — URINALYSIS, MICROSCOPIC ONLY
Ketones, ur: 15 mg/dL — AB
Nitrite: NEGATIVE
Protein, ur: NEGATIVE mg/dL
Specific Gravity, Urine: 1.025 (ref 1.005–1.030)
Urobilinogen, UA: 1 mg/dL (ref 0.0–1.0)

## 2011-11-20 LAB — COMPREHENSIVE METABOLIC PANEL
AST: 20 U/L (ref 0–37)
BUN: 13 mg/dL (ref 6–23)
CO2: 24 mEq/L (ref 19–32)
Calcium: 9.2 mg/dL (ref 8.4–10.5)
Creatinine, Ser: 0.55 mg/dL (ref 0.50–1.10)
GFR calc non Af Amer: >90 mL/min (ref >90)

## 2011-11-20 LAB — LIPASE, BLOOD: Lipase: 16 U/L (ref 11–59)

## 2011-11-20 LAB — PREGNANCY, URINE: Preg Test, Ur: NEGATIVE

## 2011-11-20 LAB — RAPID STREP SCREEN (MED CTR MEBANE ONLY): Streptococcus, Group A Screen (Direct): NEGATIVE

## 2011-11-20 MED ORDER — ONDANSETRON HCL 4 MG/2ML IJ SOLN
4.0000 mg | Freq: Once | INTRAMUSCULAR | Status: AC
  Administered 2011-11-20: 4 mg via INTRAVENOUS
  Filled 2011-11-20: qty 2

## 2011-11-20 MED ORDER — DEXAMETHASONE 1 MG/ML PO CONC
10.0000 mg | Freq: Once | ORAL | Status: AC
  Administered 2011-11-20: 10 mg via ORAL
  Filled 2011-11-20: qty 10

## 2011-11-20 MED ORDER — AMOXICILLIN-POT CLAVULANATE 500-125 MG PO TABS: 1.0000 | ORAL_TABLET | Freq: Three times a day (TID) | ORAL | Status: DC

## 2011-11-20 MED ORDER — SODIUM CHLORIDE 0.9 % IV SOLN
1000.0000 mL | Freq: Once | INTRAVENOUS | Status: AC
  Administered 2011-11-20: 1000 mL via INTRAVENOUS

## 2011-11-20 MED ORDER — ACETAMINOPHEN 325 MG PO TABS
650.0000 mg | ORAL_TABLET | Freq: Once | ORAL | Status: AC
  Administered 2011-11-20: 650 mg via ORAL
  Filled 2011-11-20: qty 2

## 2011-11-20 MED ORDER — ONDANSETRON HCL 4 MG PO TABS: 4.0000 mg | ORAL_TABLET | Freq: Four times a day (QID) | ORAL | Status: DC

## 2011-11-20 NOTE — ED Notes (Signed)
RN to obtain labs with start of IV 

## 2011-11-20 NOTE — ED Notes (Signed)
Pt states she cannot take the tylenol by mouth because she is feeling nauseated. Dr. Adriana Simas notified.

## 2011-11-20 NOTE — ED Notes (Signed)
Pt presenting to ed with multiple complaints. Pt states positive abdominal pain with positive nausea and vomiting. Pt denies diarrhea pt states she has sore throat and fever. Pt states she also has bodyaches onset x 2-3 days pt states "I have had dry heaves for the past couple of hours"

## 2011-11-20 NOTE — ED Provider Notes (Signed)
History     CSN: [id number]  Arrival date & time 11/20/11  1341   First MD Initiated Contact with Patient 11/20/11 1416      Chief Complaint  Patient presents with  . Abdominal Pain  . Fever  . Sore Throat    (Consider location/radiation/quality/duration/timing/severity/associated sxs/prior treatment) HPI The patient presents with one week of multiple complaints.  She notes that her symptoms began gradually.  Initially she complained of sore throat.  Over the following days she developed generalized discomfort, fever, nausea, anorexia, diffuse abdominal discomfort.  She denies vomiting or diarrhea.  She denies any cough.  She states that she was in her usual state of health prior to the onset of symptoms.  No relief with OTC medications.  No clear exacerbating factors. Past Medical History  Diagnosis Date  . Anxiety     History reviewed. No pertinent past surgical history.  No family history on file.  History  Substance Use Topics  . Smoking status: Current Every Day Smoker -- 1.0 packs/day    Types: Cigarettes  . Smokeless tobacco: Not on file  . Alcohol Use: Yes    OB History    Grav Para Term Preterm Abortions TAB SAB Ect Mult Living                  Review of Systems  Constitutional:       HPI  HENT:       HPI otherwise negative  Eyes: Negative.   Respiratory:       HPI, otherwise negative  Cardiovascular:       HPI, otherwise nmegative  Gastrointestinal: Negative for vomiting.  Genitourinary:       HPI, otherwise negative  Musculoskeletal:       HPI, otherwise negative  Skin: Negative.   Neurological: Negative for syncope.    Allergies  Review of patient's allergies indicates no known allergies.  Home Medications   Current Outpatient Rx  Name Route Sig Dispense Refill  . ACETAMINOPHEN 500 MG PO TABS Oral Take 1,000 mg by mouth every 6 (six) hours as needed. For pain.    Marland Kitchen CLONAZEPAM 0.5 MG PO TABS Oral Take 0.5 mg by mouth 2 (two) times daily  as needed. For anxiety       BP 125/72  Pulse 93  Temp 99 F (37.2 C) (Oral)  Resp 18  SpO2 97%  LMP 11/06/2011  Physical Exam  Nursing note and vitals reviewed. Constitutional: She is oriented to person, place, and time. She appears well-developed and well-nourished. No distress.  HENT:  Head: Normocephalic and atraumatic.  Mouth/Throat: Oropharynx is clear and moist and mucous membranes are normal. No oropharyngeal exudate, posterior oropharyngeal edema, posterior oropharyngeal erythema or tonsillar abscesses.  Eyes: Conjunctivae normal and EOM are normal. Right eye exhibits no discharge. Left eye exhibits no discharge.  Cardiovascular: Normal rate and regular rhythm.   Pulmonary/Chest: Effort normal and breath sounds normal. No stridor. No respiratory distress.  Abdominal: Soft. Normal appearance. She exhibits no distension. There is no tenderness.  Musculoskeletal: She exhibits no edema.  Lymphadenopathy:    She has cervical adenopathy.  Neurological: She is alert and oriented to person, place, and time. No cranial nerve deficit.  Skin: Skin is warm and dry.  Psychiatric: She has a normal mood and affect.    ED Course  Procedures (including critical care time)   Labs Reviewed  CBC WITH DIFFERENTIAL  COMPREHENSIVE METABOLIC PANEL  LIPASE, BLOOD  URINALYSIS, MICROSCOPIC ONLY  RAPID  STREP SCREEN  PREGNANCY, URINE   No results found.   No diagnosis found.  Pulse ox 100% room air normal   MDM  This generally W F p/w multiple complaints. On my exam she was in no distress.  With a reassuring PE, and labs, the patient was appropriate for d/c w close PMD F/U.  Given her pharyngeal complaints, she was started on ABX, though a viral etiology is equally possible.        Gerhard Munch, MD 11/24/11 0005

## 2012-06-16 ENCOUNTER — Encounter (HOSPITAL_COMMUNITY): Payer: Self-pay | Admitting: *Deleted

## 2012-06-16 ENCOUNTER — Emergency Department (HOSPITAL_COMMUNITY)
Admission: EM | Admit: 2012-06-16 | Discharge: 2012-06-16 | Disposition: A | Discharge status: Home / Self Care | Attending: Emergency Medicine | Admitting: Emergency Medicine

## 2012-06-16 DIAGNOSIS — H538 Other visual disturbances: Secondary | ICD-10-CM | POA: Insufficient documentation

## 2012-06-16 DIAGNOSIS — Y929 Unspecified place or not applicable: Secondary | ICD-10-CM | POA: Insufficient documentation

## 2012-06-16 DIAGNOSIS — F17200 Nicotine dependence, unspecified, uncomplicated: Secondary | ICD-10-CM | POA: Insufficient documentation

## 2012-06-16 DIAGNOSIS — W2209XA Striking against other stationary object, initial encounter: Secondary | ICD-10-CM | POA: Insufficient documentation

## 2012-06-16 DIAGNOSIS — G43909 Migraine, unspecified, not intractable, without status migrainosus: Secondary | ICD-10-CM | POA: Insufficient documentation

## 2012-06-16 DIAGNOSIS — Y939 Activity, unspecified: Secondary | ICD-10-CM | POA: Insufficient documentation

## 2012-06-16 DIAGNOSIS — S0990XA Unspecified injury of head, initial encounter: Secondary | ICD-10-CM | POA: Insufficient documentation

## 2012-06-16 DIAGNOSIS — R112 Nausea with vomiting, unspecified: Secondary | ICD-10-CM | POA: Insufficient documentation

## 2012-06-16 DIAGNOSIS — R61 Generalized hyperhidrosis: Secondary | ICD-10-CM | POA: Insufficient documentation

## 2012-06-16 DIAGNOSIS — F411 Generalized anxiety disorder: Secondary | ICD-10-CM | POA: Insufficient documentation

## 2012-06-16 DIAGNOSIS — Z79899 Other long term (current) drug therapy: Secondary | ICD-10-CM | POA: Insufficient documentation

## 2012-06-16 MED ORDER — DEXAMETHASONE SODIUM PHOSPHATE 10 MG/ML IJ SOLN
10.0000 mg | Freq: Once | INTRAMUSCULAR | Status: AC
  Administered 2012-06-16: 10 mg via INTRAVENOUS
  Filled 2012-06-16: qty 1

## 2012-06-16 MED ORDER — ONDANSETRON 4 MG PO TBDP
4.0000 mg | ORAL_TABLET | ORAL | Status: AC
  Administered 2012-06-16: 4 mg via ORAL
  Filled 2012-06-16: qty 1

## 2012-06-16 MED ORDER — SODIUM CHLORIDE 0.9 % IV BOLUS (SEPSIS)
1000.0000 mL | Freq: Once | INTRAVENOUS | Status: AC
  Administered 2012-06-16: 1000 mL via INTRAVENOUS

## 2012-06-16 MED ORDER — KETOROLAC TROMETHAMINE 30 MG/ML IJ SOLN
30.0000 mg | Freq: Once | INTRAMUSCULAR | Status: AC
  Administered 2012-06-16: 30 mg via INTRAVENOUS
  Filled 2012-06-16: qty 1

## 2012-06-16 MED ORDER — METOCLOPRAMIDE HCL 5 MG/ML IJ SOLN
10.0000 mg | Freq: Once | INTRAMUSCULAR | Status: AC
Start: 2012-06-16 — End: 2012-06-16
  Administered 2012-06-16: 10 mg via INTRAVENOUS
  Filled 2012-06-16: qty 2

## 2012-06-16 NOTE — ED Provider Notes (Signed)
Medical screening examination/treatment/procedure(s) were performed by non-physician practitioner and as supervising physician I was immediately available for consultation/collaboration.   Kama Cammarano L Aviana Shevlin, MD 06/16/12 0349 

## 2012-06-16 NOTE — ED Provider Notes (Signed)
History     CSN: [id number]  Arrival date & time 06/16/12  0006   First MD Initiated Contact with Patient 06/16/12 0145      Chief Complaint  Patient presents with  . Headache    (Consider location/radiation/quality/duration/timing/severity/associated sxs/prior treatment) HPI Comments: 22 year old female with a past medical history of migraines and anxiety presents to the emergency department complaining of headache x2 days. Patient states the pain is similar to her normal migraines but more severe. Describes the pain as throbbing bilateral, rated 10 out of 10. She tried taking the Vicodin that she was prescribed for mouth procedure a few weeks back without any relief. Admits to associated nausea and vomiting. Yesterday she was in her bathroom and hit the back of her head on something hard while bending over causing her pain to radiate to her neck. Denies LOC. Admits to an aura of a zig-zag in her vision. Denies confusion.   Patient is a 22 y.o. female presenting with headaches. The history is provided by the patient.  Headache Associated symptoms: nausea, photophobia and vomiting   Associated symptoms: no abdominal pain and no fever     Past Medical History  Diagnosis Date  . Anxiety     History reviewed. No pertinent past surgical history.  No family history on file.  History  Substance Use Topics  . Smoking status: Current Every Day Smoker -- 1.00 packs/day    Types: Cigarettes  . Smokeless tobacco: Not on file  . Alcohol Use: Yes    OB History   Grav Para Term Preterm Abortions TAB SAB Ect Mult Living                  Review of Systems  Constitutional: Positive for diaphoresis. Negative for fever and chills.  Eyes: Positive for photophobia and visual disturbance.  Respiratory: Negative for shortness of breath.   Cardiovascular: Negative for chest pain.  Gastrointestinal: Positive for nausea and vomiting. Negative for abdominal pain.  Neurological: Positive for  headaches.  Psychiatric/Behavioral: Negative for confusion.  All other systems reviewed and are negative.    Allergies  Review of patient's allergies indicates no known allergies.  Home Medications   Current Outpatient Rx  Name  Route  Sig  Dispense  Refill  . clonazePAM (KLONOPIN) 0.5 MG tablet   Oral   Take 0.5 mg by mouth 2 (two) times daily as needed for anxiety. For anxiety         . HYDROcodone-acetaminophen (NORCO/VICODIN) 5-325 MG per tablet   Oral   Take 1 tablet by mouth every 6 (six) hours as needed for pain.         Marland Kitchen ibuprofen (ADVIL,MOTRIN) 200 MG tablet   Oral   Take 1,800 mg by mouth once as needed for pain.         . promethazine (PHENERGAN) 25 MG tablet   Oral   Take 25 mg by mouth every 6 (six) hours as needed for nausea.           BP 122/80  Pulse 97  Temp(Src) 98.6 F (37 C) (Oral)  Resp 18  SpO2 100%  Physical Exam  Nursing note and vitals reviewed. Constitutional: She is oriented to person, place, and time. She appears well-developed and well-nourished. No distress.  HENT:  Head: Normocephalic and atraumatic. Head is without abrasion, without contusion and without laceration.    Mouth/Throat: Oropharynx is clear and moist.  Eyes: Conjunctivae and EOM are normal. Pupils are equal, round, and  reactive to light.  Neck: Normal range of motion. Neck supple.  Cardiovascular: Normal rate, regular rhythm, normal heart sounds and intact distal pulses.   Pulmonary/Chest: Effort normal and breath sounds normal. No respiratory distress.  Abdominal: Soft. Bowel sounds are normal. There is no tenderness.  Musculoskeletal: Normal range of motion. She exhibits no edema.  Neurological: She is alert and oriented to person, place, and time. She has normal strength. No cranial nerve deficit or sensory deficit. She displays a negative Romberg sign. Coordination and gait normal.  Skin: Skin is warm and dry. She is not diaphoretic.  Psychiatric: She has a  normal mood and affect. Her behavior is normal.    ED Course  Procedures (including critical care time)  Labs Reviewed - No data to display No results found.   1. Migraine       MDM  22 y/o female with migraine. No red flags concerning patient's migraine. Neuro exam unremarkable. Headache "so much better" after receiving IV fluids, decadron, reglan, toradol. Stable for discharge. Return precautions discussed. Patient states understanding of plan and is agreeable.         Trevor Mace, PA-C 06/16/12 [id number]

## 2012-06-16 NOTE — ED Notes (Addendum)
Reports 2 days of headache rates pain 11/998 reported to EMS, nausea, neck pain, front to back type pain. Has been taking Hydrocodone without relief, Phenergan without relief

## 2013-01-01 ENCOUNTER — Encounter (HOSPITAL_COMMUNITY): Payer: Self-pay | Admitting: *Deleted

## 2013-01-01 ENCOUNTER — Inpatient Hospital Stay (HOSPITAL_COMMUNITY)
Admission: AD | Admit: 2013-01-01 | Discharge: 2013-01-01 | Disposition: A | Discharge status: Home / Self Care | Source: Ambulatory Visit | Attending: Obstetrics & Gynecology | Admitting: Obstetrics & Gynecology

## 2013-01-01 DIAGNOSIS — R142 Eructation: Secondary | ICD-10-CM | POA: Insufficient documentation

## 2013-01-01 DIAGNOSIS — R109 Unspecified abdominal pain: Secondary | ICD-10-CM | POA: Insufficient documentation

## 2013-01-01 DIAGNOSIS — A5619 Other chlamydial genitourinary infection: Secondary | ICD-10-CM | POA: Insufficient documentation

## 2013-01-01 DIAGNOSIS — N739 Female pelvic inflammatory disease, unspecified: Secondary | ICD-10-CM | POA: Insufficient documentation

## 2013-01-01 DIAGNOSIS — N920 Excessive and frequent menstruation with regular cycle: Secondary | ICD-10-CM | POA: Insufficient documentation

## 2013-01-01 DIAGNOSIS — R141 Gas pain: Secondary | ICD-10-CM | POA: Insufficient documentation

## 2013-01-01 DIAGNOSIS — A749 Chlamydial infection, unspecified: Secondary | ICD-10-CM

## 2013-01-01 DIAGNOSIS — R110 Nausea: Secondary | ICD-10-CM | POA: Insufficient documentation

## 2013-01-01 LAB — WET PREP, GENITAL
Clue Cells Wet Prep HPF POC: NONE SEEN
Trich, Wet Prep: NONE SEEN
WBC, Wet Prep HPF POC: NONE SEEN
Yeast Wet Prep HPF POC: NONE SEEN

## 2013-01-01 LAB — URINALYSIS, ROUTINE W REFLEX MICROSCOPIC
Glucose, UA: NEGATIVE mg/dL
Ketones, ur: 15 mg/dL — AB
pH: 6 (ref 5.0–8.0)

## 2013-01-01 LAB — URINE MICROSCOPIC-ADD ON

## 2013-01-01 MED ORDER — PROMETHAZINE HCL 25 MG PO TABS: 25.0000 mg | ORAL_TABLET | Freq: Four times a day (QID) | ORAL | Status: DC | PRN

## 2013-01-01 MED ORDER — KETOROLAC TROMETHAMINE 60 MG/2ML IM SOLN
60.0000 mg | Freq: Once | INTRAMUSCULAR | Status: AC
  Administered 2013-01-01: 60 mg via INTRAMUSCULAR
  Filled 2013-01-01: qty 2

## 2013-01-01 MED ORDER — PROMETHAZINE HCL 25 MG/ML IJ SOLN
25.0000 mg | Freq: Once | INTRAMUSCULAR | Status: AC
  Administered 2013-01-01: 25 mg via INTRAMUSCULAR
  Filled 2013-01-01: qty 1

## 2013-01-01 MED ORDER — MEDROXYPROGESTERONE ACETATE 150 MG/ML IM SUSP
150.0000 mg | Freq: Once | INTRAMUSCULAR | Status: AC
  Administered 2013-01-01: 150 mg via INTRAMUSCULAR
  Filled 2013-01-01: qty 1

## 2013-01-01 MED ORDER — AZITHROMYCIN 250 MG PO TABS
1000.0000 mg | ORAL_TABLET | Freq: Once | ORAL | Status: AC
  Administered 2013-01-01: 1000 mg via ORAL
  Filled 2013-01-01: qty 4

## 2013-01-01 NOTE — MAU Note (Signed)
Pt states was treated for BV, was unable to continue meds r/t intense abdominal cramping. Went to [facility] and was tested for std's r/t partner cheating. Was told +chlamydia, was going Monday to get std treatment. Having constant abd pain rated 6/10. Going through a super heavy pad q4 hours. Is unsure if pain and bleeding are r/t chlamydia or not. Has had fever and chills for past few days also. P t states no s/s until she began taking flagyl for bv.

## 2013-01-01 NOTE — MAU Note (Signed)
Patient presents with complaint of abdominal pain since Monday and vaginal bleeding (dark brown discharge) since Monday after taking Flagyl.

## 2013-01-01 NOTE — MAU Provider Note (Signed)
Attestation of Attending Supervision of Advanced Practitioner (CNM/NP): Evaluation and management procedures were performed by the Advanced Practitioner under my supervision and collaboration.  I have reviewed the Advanced Practitioner's note and chart, and I agree with the management and plan.  HARRAWAY-SMITH, Ediberto Sens 11:06 PM

## 2013-01-01 NOTE — MAU Note (Signed)
Unable to take pills unless she is given phenergan is given first.

## 2013-01-01 NOTE — MAU Provider Note (Signed)
History     CSN: [id number]  Arrival date and time: 01/01/13 1623   First Provider Initiated Contact with Patient 01/01/13 1731      Chief Complaint  Patient presents with  . Abdominal Pain  . Vaginal Bleeding   HPI Comments: Jane Gould 22 y.o. No obstetric history on file presents to MAU with several complaints today that include nausea, bloated abdomen with pain about 6/10 and heavy menstrual bleeding. She was diagnosed with chlamydia and BV a week ago and given Flagyl with she has had a strong nausea reaction with bloating. She has also switched birth control pills from Estrogen containing to progestin only for migraine with aura. In addition she and her live in boyfriend broke up over cheating episode, so that she has been quite emotionally upset lately. She has not had any intercourse in last 3 weeks.     Patient is a 22 y.o. female presenting with abdominal pain and vaginal bleeding.  Abdominal Pain The primary symptoms of the illness include abdominal pain and nausea.  Vaginal Bleeding Associated symptoms include abdominal pain, headaches and nausea.      Past Medical History  Diagnosis Date  . Anxiety     Past Surgical History  Procedure Laterality Date  . No past surgeries      Family History  Problem Relation Age of Onset  . Heart disease Paternal Grandfather     History  Substance Use Topics  . Smoking status: Current Every Day Smoker -- 1.00 packs/day    Types: Cigarettes  . Smokeless tobacco: Never Used  . Alcohol Use: Yes    Allergies: No Known Allergies  Prescriptions prior to admission  Medication Sig Dispense Refill  . clonazePAM (KLONOPIN) 0.5 MG tablet Take 0.5 mg by mouth 2 (two) times daily as needed for anxiety. For anxiety      . naproxen sodium (ANAPROX) 220 MG tablet Take 440 mg by mouth daily as needed (cramping).      . norethindrone (CAMILA) 0.35 MG tablet Take 1 tablet by mouth daily.        Review of Systems  Constitutional:  Negative.   Eyes: Negative.   Respiratory: Negative.   Cardiovascular: Negative.   Gastrointestinal: Positive for nausea and abdominal pain.  Genitourinary: Negative.        Heavy vaginal bleeding  Musculoskeletal: Negative.   Skin: Negative.   Neurological: Positive for dizziness and headaches.  Psychiatric/Behavioral: The patient is nervous/anxious.    Physical Exam   Blood pressure 151/84, pulse 116, temperature 98.7 F (37.1 C), temperature source Oral, resp. rate 18, height 5' 4.5" (1.638 m), weight 147 lb 2 oz (66.735 kg), last menstrual period 12/26/2012.  Physical Exam  Constitutional: She is oriented to person, place, and time. She appears well-developed and well-nourished. No distress.  HENT:  Head: Normocephalic and atraumatic.  Eyes: Pupils are equal, round, and reactive to light.  Cardiovascular: Normal rate, regular rhythm and normal heart sounds.   Respiratory: Effort normal and breath sounds normal. No respiratory distress.  GI: Soft. Bowel sounds are normal. She exhibits distension. There is tenderness.  Genitourinary:  Genitalia: External: negative Vagina: 2 Fox swaps filled with blood Cervix: closed Biman: uterine tenderness  Musculoskeletal: Normal range of motion.  Neurological: She is alert and oriented to person, place, and time.  Skin: Skin is warm and dry.  Psychiatric: Her mood appears anxious.   Results for orders placed during the hospital encounter of 01/01/13 (from the past 24 hour(s))  URINALYSIS, ROUTINE W REFLEX MICROSCOPIC     Status: Abnormal   Collection Time    01/01/13  5:10 PM      Result Value Range   Color, Urine YELLOW  YELLOW   APPearance CLEAR  CLEAR   Specific Gravity, Urine >1.030 (*) 1.005 - 1.030   pH 6.0  5.0 - 8.0   Glucose, UA NEGATIVE  NEGATIVE mg/dL   Hgb urine dipstick SMALL (*) NEGATIVE   Bilirubin Urine SMALL (*) NEGATIVE   Ketones, ur 15 (*) NEGATIVE mg/dL   Protein, ur NEGATIVE  NEGATIVE mg/dL   Urobilinogen,  UA 0.2  0.0 - 1.0 mg/dL   Nitrite NEGATIVE  NEGATIVE   Leukocytes, UA NEGATIVE  NEGATIVE  URINE MICROSCOPIC-ADD ON     Status: Abnormal   Collection Time    01/01/13  5:10 PM      Result Value Range   Squamous Epithelial / LPF FEW (*) RARE   WBC, UA 0-2  <3 WBC/hpf   RBC / HPF 0-2  <3 RBC/hpf   Bacteria, UA MANY (*) RARE   Urine-Other MUCOUS PRESENT    POCT PREGNANCY, URINE     Status: None   Collection Time    01/01/13  7:13 PM      Result Value Range   Preg Test, Ur NEGATIVE  NEGATIVE  WET PREP, GENITAL     Status: None   Collection Time    01/01/13  7:51 PM      Result Value Range   Yeast Wet Prep HPF POC NONE SEEN  NONE SEEN   Trich, Wet Prep NONE SEEN  NONE SEEN   Clue Cells Wet Prep HPF POC NONE SEEN  NONE SEEN   WBC, Wet Prep HPF POC NONE SEEN  NONE SEEN     MAU Course  Procedures  MDM Wet prep, GC, chlamydia Toradol 60 mg , phenergan 25 mg IM. Pt's pain and nausea decreased to 4/ 10 1 Gram Zithromax Depo Provera 150 mg IM  Assessment and Plan   A: Abdominal pain, nausea, bloating Heavy menstrual bleeding Chlamydia  P:  Will treat Chlamydia with Zithromax 1 Gram Improvement with nausea/ bloating with phenergan/ will send home RX Pain likely related to menses and nausea/ Toradol helped Will give Depo for bleeding and she can follow up with [facility] Dept for changes as needed   Carolynn Serve 01/01/2013, 8:02 PM

## 2013-06-21 ENCOUNTER — Emergency Department: Payer: Self-pay | Admitting: Internal Medicine

## 2013-06-21 LAB — CBC
HCT: 43.6 % (ref 35.0–47.0)
HGB: 14.7 g/dL (ref 12.0–16.0)
MCH: 30.5 pg (ref 26.0–34.0)
MCHC: 33.7 g/dL (ref 32.0–36.0)
MCV: 90 fL (ref 80–100)
Platelet: 328 10*3/uL (ref 150–440)
RBC: 4.82 10*6/uL (ref 3.80–5.20)
RDW: 12.8 % (ref 11.5–14.5)
WBC: 9.3 10*3/uL (ref 3.6–11.0)

## 2013-06-21 LAB — URINALYSIS, COMPLETE
BILIRUBIN, UR: NEGATIVE
Glucose,UR: NEGATIVE mg/dL (ref 0–75)
KETONE: NEGATIVE
LEUKOCYTE ESTERASE: NEGATIVE
Nitrite: NEGATIVE
PH: 9 (ref 4.5–8.0)
PROTEIN: NEGATIVE
Specific Gravity: 1.02 (ref 1.003–1.030)

## 2013-06-21 LAB — WET PREP, GENITAL

## 2013-06-21 LAB — GC/CHLAMYDIA PROBE AMP

## 2013-06-21 LAB — PREGNANCY, URINE: PREGNANCY TEST, URINE: NEGATIVE m[IU]/mL

## 2014-01-26 ENCOUNTER — Emergency Department (HOSPITAL_COMMUNITY)
Admission: EM | Admit: 2014-01-26 | Discharge: 2014-01-26 | Disposition: A | Discharge status: Home / Self Care | Attending: Emergency Medicine | Admitting: Emergency Medicine

## 2014-01-26 ENCOUNTER — Encounter (HOSPITAL_COMMUNITY): Payer: Self-pay

## 2014-01-26 DIAGNOSIS — Z3202 Encounter for pregnancy test, result negative: Secondary | ICD-10-CM | POA: Insufficient documentation

## 2014-01-26 DIAGNOSIS — R109 Unspecified abdominal pain: Secondary | ICD-10-CM

## 2014-01-26 DIAGNOSIS — R1084 Generalized abdominal pain: Secondary | ICD-10-CM | POA: Diagnosis not present

## 2014-01-26 DIAGNOSIS — R112 Nausea with vomiting, unspecified: Secondary | ICD-10-CM | POA: Insufficient documentation

## 2014-01-26 DIAGNOSIS — Z720 Tobacco use: Secondary | ICD-10-CM | POA: Insufficient documentation

## 2014-01-26 DIAGNOSIS — R197 Diarrhea, unspecified: Secondary | ICD-10-CM | POA: Diagnosis not present

## 2014-01-26 DIAGNOSIS — F419 Anxiety disorder, unspecified: Secondary | ICD-10-CM | POA: Diagnosis not present

## 2014-01-26 DIAGNOSIS — R110 Nausea: Secondary | ICD-10-CM

## 2014-01-26 LAB — COMPREHENSIVE METABOLIC PANEL
ALK PHOS: 75 U/L (ref 39–117)
ALT: 21 U/L (ref 0–35)
AST: 23 U/L (ref 0–37)
Albumin: 4.4 g/dL (ref 3.5–5.2)
Anion gap: 15 (ref 5–15)
BUN: 11 mg/dL (ref 6–23)
CALCIUM: 9.7 mg/dL (ref 8.4–10.5)
CO2: 24 meq/L (ref 19–32)
Chloride: 97 mEq/L (ref 96–112)
Creatinine, Ser: 0.76 mg/dL (ref 0.50–1.10)
GLUCOSE: 119 mg/dL — AB (ref 70–99)
Potassium: 4.8 mEq/L (ref 3.7–5.3)
Sodium: 136 mEq/L — ABNORMAL LOW (ref 137–147)
Total Bilirubin: 0.5 mg/dL (ref 0.3–1.2)
Total Protein: 7.8 g/dL (ref 6.0–8.3)

## 2014-01-26 LAB — CBC WITH DIFFERENTIAL/PLATELET
Basophils Absolute: 0 10*3/uL (ref 0.0–0.1)
Basophils Relative: 0 % (ref 0–1)
EOS PCT: 1 % (ref 0–5)
Eosinophils Absolute: 0.1 10*3/uL (ref 0.0–0.7)
HEMATOCRIT: 43.2 % (ref 36.0–46.0)
Hemoglobin: 14.6 g/dL (ref 12.0–15.0)
LYMPHS ABS: 0.9 10*3/uL (ref 0.7–4.0)
Lymphocytes Relative: 6 % — ABNORMAL LOW (ref 12–46)
MCH: 30.7 pg (ref 26.0–34.0)
MCHC: 33.8 g/dL (ref 30.0–36.0)
MCV: 90.8 fL (ref 78.0–100.0)
Monocytes Absolute: 0.9 10*3/uL (ref 0.1–1.0)
Monocytes Relative: 6 % (ref 3–12)
NEUTROS ABS: 13.7 10*3/uL — AB (ref 1.7–7.7)
Neutrophils Relative %: 87 % — ABNORMAL HIGH (ref 43–77)
Platelets: 317 10*3/uL (ref 150–400)
RBC: 4.76 MIL/uL (ref 3.87–5.11)
RDW: 12.6 % (ref 11.5–15.5)
WBC: 15.7 10*3/uL — AB (ref 4.0–10.5)

## 2014-01-26 LAB — LIPASE, BLOOD: Lipase: 23 U/L (ref 11–59)

## 2014-01-26 LAB — URINALYSIS, ROUTINE W REFLEX MICROSCOPIC
Bilirubin Urine: NEGATIVE
GLUCOSE, UA: NEGATIVE mg/dL
Hgb urine dipstick: NEGATIVE
KETONES UR: NEGATIVE mg/dL
LEUKOCYTES UA: NEGATIVE
Nitrite: NEGATIVE
Protein, ur: NEGATIVE mg/dL
Specific Gravity, Urine: 1.024 (ref 1.005–1.030)
Urobilinogen, UA: 0.2 mg/dL (ref 0.0–1.0)
pH: 7.5 (ref 5.0–8.0)

## 2014-01-26 LAB — POC URINE PREG, ED: PREG TEST UR: NEGATIVE

## 2014-01-26 MED ORDER — SODIUM CHLORIDE 0.9 % IV BOLUS (SEPSIS)
1000.0000 mL | Freq: Once | INTRAVENOUS | Status: AC
  Administered 2014-01-26: 1000 mL via INTRAVENOUS

## 2014-01-26 MED ORDER — PROMETHAZINE HCL 25 MG/ML IJ SOLN
12.5000 mg | Freq: Once | INTRAMUSCULAR | Status: AC
  Administered 2014-01-26: 12.5 mg via INTRAVENOUS
  Filled 2014-01-26: qty 1

## 2014-01-26 MED ORDER — HYOSCYAMINE SULFATE 0.125 MG PO TABS
0.2500 mg | ORAL_TABLET | Freq: Once | ORAL | Status: DC
  Filled 2014-01-26: qty 2

## 2014-01-26 MED ORDER — HYOSCYAMINE SULFATE 0.125 MG PO TBDP
0.2500 mg | ORAL_TABLET | Freq: Once | ORAL | Status: AC
Start: 2014-01-26 — End: 2014-01-26
  Administered 2014-01-26: 0.25 mg via SUBLINGUAL
  Filled 2014-01-26: qty 2

## 2014-01-26 MED ORDER — ONDANSETRON HCL 4 MG/2ML IJ SOLN
4.0000 mg | Freq: Once | INTRAMUSCULAR | Status: AC
  Administered 2014-01-26: 4 mg via INTRAVENOUS
  Filled 2014-01-26: qty 2

## 2014-01-26 MED ORDER — ONDANSETRON HCL 4 MG PO TABS: 4.0000 mg | ORAL_TABLET | Freq: Four times a day (QID) | ORAL | Status: DC

## 2014-01-26 MED ORDER — DICYCLOMINE HCL 20 MG PO TABS: 20.0000 mg | ORAL_TABLET | Freq: Two times a day (BID) | ORAL | Status: DC

## 2014-01-26 NOTE — ED Notes (Signed)
Provided patient two warm blankets.  

## 2014-01-26 NOTE — ED Provider Notes (Signed)
Care transferred to me by Jaynie Crumbleatyana Kirichenko PA-C. Pt with cramping, N/V. Plan is to give fluids, antiemetics, reassess.  Patient reassessed at 0700, reports she feels much better after fluids and anti nausea/spasmotics. Her cramping has resolved as well as her nausea. Benign abdominal exam. No evidence of acute abdomen. Vitals stable WNL- afebrile. Tolerating PO  Will DC with Zofran and Bentyl to assist with nausea and cramping. Recommended follow-up with her primary care. Discussed return precautions, patient amenable to plan At this time patient feels well and is in stable condition and is appropriate for discharge. Filed Vitals:   01/26/14 0743  BP: 124/65  Pulse: 95  Temp: 98.2 F (36.8 C)  Resp: [address]16     Maryhelen Lindler W Kenoshaartner, [place] JerseyPA-C 01/26/14 [id number]  Toy CookeyMegan Docherty, MD 01/29/14 2135

## 2014-01-26 NOTE — ED Notes (Signed)
Pt complains of abd cramping since 6pm, she states she started vomiting and having diarrhea since 4am

## 2014-01-26 NOTE — Discharge Instructions (Signed)
Abdominal Pain, Women °Abdominal (stomach, pelvic, or belly) pain can be caused by many things. It is important to tell your doctor: °· The location of the pain. °· Does it come and go or is it present all the time? °· Are there things that start the pain (eating certain foods, exercise)? °· Are there other symptoms associated with the pain (fever, nausea, vomiting, diarrhea)? °All of this is helpful to know when trying to find the cause of the pain. °CAUSES  °· Stomach: virus or bacteria infection, or ulcer. °· Intestine: appendicitis (inflamed appendix), regional ileitis (Crohn's disease), ulcerative colitis (inflamed colon), irritable bowel syndrome, diverticulitis (inflamed diverticulum of the colon), or cancer of the stomach or intestine. °· Gallbladder disease or stones in the gallbladder. °· Kidney disease, kidney stones, or infection. °· Pancreas infection or cancer. °· Fibromyalgia (pain disorder). °· Diseases of the female organs: °¨ Uterus: fibroid (non-cancerous) tumors or infection. °¨ Fallopian tubes: infection or tubal pregnancy. °¨ Ovary: cysts or tumors. °¨ Pelvic adhesions (scar tissue). °¨ Endometriosis (uterus lining tissue growing in the pelvis and on the pelvic organs). °¨ Pelvic congestion syndrome (female organs filling up with blood just before the menstrual period). °¨ Pain with the menstrual period. °¨ Pain with ovulation (producing an egg). °¨ Pain with an IUD (intrauterine device, birth control) in the uterus. °¨ Cancer of the female organs. °· Functional pain (pain not caused by a disease, may improve without treatment). °· Psychological pain. °· Depression. °DIAGNOSIS  °Your doctor will decide the seriousness of your pain by doing an examination. °· Blood tests. °· X-rays. °· Ultrasound. °· CT scan (computed tomography, special type of X-ray). °· MRI (magnetic resonance imaging). °· Cultures, for infection. °· Barium enema (dye inserted in the large intestine, to better view it with  X-rays). °· Colonoscopy (looking in intestine with a lighted tube). °· Laparoscopy (minor surgery, looking in abdomen with a lighted tube). °· Major abdominal exploratory surgery (looking in abdomen with a large incision). °TREATMENT  °The treatment will depend on the cause of the pain.  °· Many cases can be observed and treated at home. °· Over-the-counter medicines recommended by your caregiver. °· Prescription medicine. °· Antibiotics, for infection. °· Birth control pills, for painful periods or for ovulation pain. °· Hormone treatment, for endometriosis. °· Nerve blocking injections. °· Physical therapy. °· Antidepressants. °· Counseling with a psychologist or psychiatrist. °· Minor or major surgery. °HOME CARE INSTRUCTIONS  °· Do not take laxatives, unless directed by your caregiver. °· Take over-the-counter pain medicine only if ordered by your caregiver. Do not take aspirin because it can cause an upset stomach or bleeding. °· Try a clear liquid diet (broth or water) as ordered by your caregiver. Slowly move to a bland diet, as tolerated, if the pain is related to the stomach or intestine. °· Have a thermometer and take your temperature several times a day, and record it. °· Bed rest and sleep, if it helps the pain. °· Avoid sexual intercourse, if it causes pain. °· Avoid stressful situations. °· Keep your follow-up appointments and tests, as your caregiver orders. °· If the pain does not go away with medicine or surgery, you may try: °¨ Acupuncture. °¨ Relaxation exercises (yoga, meditation). °¨ Group therapy. °¨ Counseling. °SEEK MEDICAL CARE IF:  °· You notice certain foods cause stomach pain. °· Your home care treatment is not helping your pain. °· You need stronger pain medicine. °· You want your IUD removed. °· You feel faint or   lightheaded.  You develop nausea and vomiting.  You develop a rash.  You are having side effects or an allergy to your medicine. SEEK IMMEDIATE MEDICAL CARE IF:   Your  pain does not go away or gets worse.  You have a fever.  Your pain is felt only in portions of the abdomen. The right side could possibly be appendicitis. The left lower portion of the abdomen could be colitis or diverticulitis.  You are passing blood in your stools (bright red or black tarry stools, with or without vomiting).  You have blood in your urine.  You develop chills, with or without a fever.  You pass out. MAKE SURE YOU:   Understand these instructions.  Will watch your condition.  Will get help right away if you are not doing well or get worse. Document Released: 12/14/2006 Document Revised: 07/03/2013 Document Reviewed: 01/03/2009 [facility] Patient Information 2015 [employer], [place]. This information is not intended to replace advice given to you by your health care provider. Make sure you discuss any questions you have with your health care provider.   You were evaluated in the ED today for your abdominal discomfort, nausea and cramping. He reported feeling better after administration of fluids, antiemetics and anti-spasmodics. You'll be given a prescription for anti-emetics and anti-spasmodics, please take as directed. If you experience worsening symptoms please return to ED for further evaluation. You may follow-up with your primary care within the next 3-5 days for further evaluation and management

## 2014-01-26 NOTE — ED Provider Notes (Signed)
CSN: [id number]     Arrival date & time 01/26/14  [id number] History   First MD Initiated Contact with Patient 01/26/14 0444     Chief Complaint  Patient presents with  . Emesis     (Consider location/radiation/quality/duration/timing/severity/associated sxs/prior Treatment) HPI Jane Gould is a 23 y.o. female who presents to emergency department complaining of nausea, vomiting, diarrhea, abdominal pain. Patient states Jane Gould abdominal cramping began around 6 PM last night. States Jane Gould woke up about 2 hours ago with vomiting, diarrhea. States Jane Gould had multiple episodes of both. Jane Gould did not try any medications prior to coming in. Jane Gould states abdominal cramping comes and goes, states it comes in waves. Jane Gould denies being pregnant. No urinary symptoms. No abnormal vaginal discharge or bleeding. No fever. No recent ill contacts. Patient states Jane Gould had been skipping dinner last night with Jane Gould family, states no one else is sick. Jane Gould denies any back pain.  Past Medical History  Diagnosis Date  . Anxiety    Past Surgical History  Procedure Laterality Date  . No past surgeries     Family History  Problem Relation Age of Onset  . Heart disease Paternal Grandfather    History  Substance Use Topics  . Smoking status: Current Every Day Smoker -- 1.00 packs/day    Types: Cigarettes  . Smokeless tobacco: Never Used  . Alcohol Use: Yes   OB History    No data available     Review of Systems  Constitutional: Negative for fever and chills.  Respiratory: Negative for cough, chest tightness and shortness of breath.   Cardiovascular: Negative for chest pain, palpitations and leg swelling.  Gastrointestinal: Positive for nausea, vomiting, abdominal pain and diarrhea.  Genitourinary: Negative for dysuria, urgency, frequency, hematuria, flank pain, vaginal bleeding, vaginal discharge, vaginal pain and pelvic pain.  Musculoskeletal: Negative for myalgias, arthralgias, neck pain and neck stiffness.  Skin:  Negative for rash.  Neurological: Negative for dizziness, weakness and headaches.  All other systems reviewed and are negative.     Allergies  Review of patient's allergies indicates no known allergies.  Home Medications   Prior to Admission medications   Medication Sig Start Date End Date Taking? Authorizing Provider  clonazePAM (KLONOPIN) 0.5 MG tablet Take 0.5 mg by mouth 2 (two) times daily as needed for anxiety. For anxiety    Historical Provider, MD  naproxen sodium (ANAPROX) 220 MG tablet Take 440 mg by mouth daily as needed (cramping).    Historical Provider, MD  promethazine (PHENERGAN) 25 MG tablet Take 1 tablet (25 mg total) by mouth every 6 (six) hours as needed for nausea. 01/01/13   Delbert PhenixLinda M Barefoot, NP   BP 158/96 mmHg  Pulse 106  Temp(Src) 98.1 F (36.7 C) (Oral)  Resp 18  SpO2 100%  LMP 12/26/2013 Physical Exam  Constitutional: Jane Gould is oriented to person, place, and time. Jane Gould appears well-developed and well-nourished. No distress.  HENT:  Head: Normocephalic.  Eyes: Conjunctivae are normal.  Neck: Neck supple.  Cardiovascular: Normal rate, regular rhythm and normal heart sounds.   Pulmonary/Chest: Effort normal and breath sounds normal. No respiratory distress. Jane Gould has no wheezes. Jane Gould has no rales.  Abdominal: Soft. Bowel sounds are normal. Jane Gould exhibits no distension. There is tenderness. There is no rebound and no guarding.  Diffuse tenderness  Musculoskeletal: Jane Gould exhibits no edema.  Neurological: Jane Gould is alert and oriented to person, place, and time.  Skin: Skin is warm and dry.  Psychiatric: Jane Gould has a normal mood  and affect. Jane Gould behavior is normal.  Nursing note and vitals reviewed.   ED Course  Procedures (including critical care time) Labs Review Labs Reviewed  CBC WITH DIFFERENTIAL - Abnormal; Notable for the following:    WBC 15.7 (*)    Neutrophils Relative % 87 (*)    Neutro Abs 13.7 (*)    Lymphocytes Relative 6 (*)    All other components  within normal limits  COMPREHENSIVE METABOLIC PANEL - Abnormal; Notable for the following:    Sodium 136 (*)    Glucose, Bld 119 (*)    All other components within normal limits  LIPASE, BLOOD  URINALYSIS, ROUTINE W REFLEX MICROSCOPIC  POC URINE PREG, ED  POC URINE PREG, ED    Imaging Review No results found.   EKG Interpretation None      MDM   Final diagnoses:  None    Pt with nausea, vomiting, abdominal cramping, diarrhea. Abdomen bening. Ordered iv fluids. Zofran.   Pt feeling slightly better after zofran. Signed out at shift change to PA cartner at shift change. Doubt acute abdomen, most likely enteritis given symptoms. Plan to PO challenge if feeling better.   Filed Vitals:   01/26/14 0444 01/26/14 0743  BP: 158/96 124/65  Pulse: 106 95  Temp: 98.1 F (36.7 C) 98.2 F (36.8 C)  TempSrc: Oral Oral  Resp: 18 16  SpO2: 100% 98%      Lottie Musselatyana A Izumi Mixon, PA-C 01/29/14 2340  Toy CookeyMegan Docherty, MD 02/01/14 [phone]

## 2014-01-26 NOTE — ED Notes (Signed)
Patient complains of generalized abd pain that started at 18:00 yesterday. Patient reports the lateral sides of abd are cramping and medial abd is burning. Pain is constant with associated nausea, vomiting, and diarrhea.

## 2014-01-26 NOTE — ED Notes (Signed)
Pt given ginger ale for fluid challenge. She sts her pain is about 2, however she is feeling nauseous.

## 2014-02-19 ENCOUNTER — Telehealth (HOSPITAL_BASED_OUTPATIENT_CLINIC_OR_DEPARTMENT_OTHER): Payer: Self-pay | Admitting: Emergency Medicine

## 2015-03-03 NOTE — L&D Delivery Note (Signed)
Delivery Note - w/ PPH  Patient pushed for less than 30 minutes after she was noted to be C/C/+2. At 1:46 AM a viable and healthy female was delivered over intact perineum via Vaginal, Spontaneous Delivery (Presentation: LOA ).  APGAR: 7, 8; weight  Pending. Shoulders and body were easily delivered and cord double clamped and cut. Placenta was delivered spontaneously intact, 3 vessel cord noted.  After  Delivery uterine atony was noted despite massage and IV pitocin. Magnesium sulfate was held.  PPH was called and ancillary team came.  IM hemabate was administered, followed by PR 1000 mcg of cytotec.  There were no noted vaginal, perineal or cervical lacerations.  Foley bulb was in place.   Hemostasis was achieved.  Patient remained hemodynamically stable.    Anesthesia:  Epidural Episiotomy: None Lacerations:  None Suture Repair: n/a Est. Blood Loss (mL): 800  Mom to postpartum.  Baby to Couplet care / Skin to Skin.  Jane Gould, Jane Gould STACIA 12/26/2015, 2:52 AM

## 2015-05-31 LAB — OB RESULTS CONSOLE RPR: RPR: NONREACTIVE

## 2015-05-31 LAB — OB RESULTS CONSOLE RUBELLA ANTIBODY, IGM: RUBELLA: IMMUNE

## 2015-05-31 LAB — OB RESULTS CONSOLE GC/CHLAMYDIA
CHLAMYDIA, DNA PROBE: NEGATIVE
GC PROBE AMP, GENITAL: NEGATIVE

## 2015-05-31 LAB — OB RESULTS CONSOLE HIV ANTIBODY (ROUTINE TESTING): HIV: NONREACTIVE

## 2015-05-31 LAB — OB RESULTS CONSOLE HEPATITIS B SURFACE ANTIGEN: Hepatitis B Surface Ag: NEGATIVE

## 2015-11-14 ENCOUNTER — Other Ambulatory Visit (HOSPITAL_COMMUNITY): Payer: Self-pay | Admitting: Obstetrics and Gynecology

## 2015-11-14 DIAGNOSIS — Z3A33 33 weeks gestation of pregnancy: Secondary | ICD-10-CM

## 2015-11-14 DIAGNOSIS — Z3689 Encounter for other specified antenatal screening: Secondary | ICD-10-CM

## 2015-11-15 ENCOUNTER — Encounter (HOSPITAL_COMMUNITY): Payer: Self-pay

## 2015-11-18 ENCOUNTER — Other Ambulatory Visit (HOSPITAL_COMMUNITY): Payer: Self-pay | Admitting: Obstetrics and Gynecology

## 2015-11-18 ENCOUNTER — Ambulatory Visit (HOSPITAL_COMMUNITY)
Admission: RE | Admit: 2015-11-18 | Discharge: 2015-11-18 | Disposition: A | Discharge status: Home / Self Care | Payer: Medicaid Other | Source: Ambulatory Visit | Attending: Obstetrics and Gynecology | Admitting: Obstetrics and Gynecology

## 2015-11-18 ENCOUNTER — Encounter (HOSPITAL_COMMUNITY): Payer: Self-pay

## 2015-11-18 DIAGNOSIS — O26843 Uterine size-date discrepancy, third trimester: Secondary | ICD-10-CM | POA: Insufficient documentation

## 2015-11-18 DIAGNOSIS — Z3689 Encounter for other specified antenatal screening: Secondary | ICD-10-CM

## 2015-11-18 DIAGNOSIS — Z36 Encounter for antenatal screening of mother: Secondary | ICD-10-CM | POA: Diagnosis not present

## 2015-11-18 DIAGNOSIS — Z3A33 33 weeks gestation of pregnancy: Secondary | ICD-10-CM | POA: Diagnosis not present

## 2015-11-18 DIAGNOSIS — O99333 Smoking (tobacco) complicating pregnancy, third trimester: Secondary | ICD-10-CM

## 2015-11-21 ENCOUNTER — Encounter (HOSPITAL_COMMUNITY): Payer: Self-pay

## 2015-11-21 ENCOUNTER — Other Ambulatory Visit (HOSPITAL_COMMUNITY): Payer: Self-pay

## 2015-12-09 LAB — OB RESULTS CONSOLE GBS: GBS: NEGATIVE

## 2015-12-24 ENCOUNTER — Encounter (HOSPITAL_COMMUNITY): Payer: Self-pay | Admitting: *Deleted

## 2015-12-24 ENCOUNTER — Inpatient Hospital Stay (HOSPITAL_COMMUNITY)
Admission: AD | Admit: 2015-12-24 | Discharge: 2015-12-28 | DRG: 774 | Disposition: A | Discharge status: Home / Self Care | Source: Ambulatory Visit | Attending: Obstetrics & Gynecology | Admitting: Obstetrics & Gynecology

## 2015-12-24 DIAGNOSIS — O99334 Smoking (tobacco) complicating childbirth: Secondary | ICD-10-CM | POA: Diagnosis present

## 2015-12-24 DIAGNOSIS — O1414 Severe pre-eclampsia complicating childbirth: Secondary | ICD-10-CM | POA: Diagnosis present

## 2015-12-24 DIAGNOSIS — O139 Gestational [pregnancy-induced] hypertension without significant proteinuria, unspecified trimester: Secondary | ICD-10-CM | POA: Diagnosis present

## 2015-12-24 DIAGNOSIS — F17210 Nicotine dependence, cigarettes, uncomplicated: Secondary | ICD-10-CM | POA: Diagnosis present

## 2015-12-24 DIAGNOSIS — Z3A38 38 weeks gestation of pregnancy: Secondary | ICD-10-CM

## 2015-12-24 DIAGNOSIS — F419 Anxiety disorder, unspecified: Secondary | ICD-10-CM | POA: Diagnosis present

## 2015-12-24 LAB — CBC
HCT: 34.5 % — ABNORMAL LOW (ref 36.0–46.0)
HEMOGLOBIN: 11.8 g/dL — AB (ref 12.0–15.0)
MCH: 29.9 pg (ref 26.0–34.0)
MCHC: 34.2 g/dL (ref 30.0–36.0)
MCV: 87.3 fL (ref 78.0–100.0)
Platelets: 207 10*3/uL (ref 150–400)
RBC: 3.95 MIL/uL (ref 3.87–5.11)
RDW: 13.6 % (ref 11.5–15.5)
WBC: 11.6 10*3/uL — ABNORMAL HIGH (ref 4.0–10.5)

## 2015-12-24 LAB — COMPREHENSIVE METABOLIC PANEL
ALBUMIN: 3.1 g/dL — AB (ref 3.5–5.0)
ALK PHOS: 158 U/L — AB (ref 38–126)
ALT: 11 U/L — ABNORMAL LOW (ref 14–54)
ANION GAP: 6 (ref 5–15)
AST: 19 U/L (ref 15–41)
BILIRUBIN TOTAL: 0.3 mg/dL (ref 0.3–1.2)
BUN: 10 mg/dL (ref 6–20)
CALCIUM: 8.9 mg/dL (ref 8.9–10.3)
CO2: 21 mmol/L — ABNORMAL LOW (ref 22–32)
Chloride: 107 mmol/L (ref 101–111)
Creatinine, Ser: 0.57 mg/dL (ref 0.44–1.00)
GFR calc Af Amer: >60 mL/min (ref >60)
GFR calc non Af Amer: >60 mL/min (ref >60)
GLUCOSE: 70 mg/dL (ref 65–99)
Potassium: 4 mmol/L (ref 3.5–5.1)
Sodium: 134 mmol/L — ABNORMAL LOW (ref 135–145)
TOTAL PROTEIN: 6.1 g/dL — AB (ref 6.5–8.1)

## 2015-12-24 LAB — PROTEIN / CREATININE RATIO, URINE
Creatinine, Urine: 82 mg/dL
Protein Creatinine Ratio: 0.07 mg/mg{Cre} (ref 0.00–0.15)
Total Protein, Urine: 6 mg/dL

## 2015-12-24 LAB — LACTATE DEHYDROGENASE: LDH: 179 U/L (ref 98–192)

## 2015-12-24 LAB — ABO/RH: ABO/RH(D): A POS

## 2015-12-24 MED ORDER — SOD CITRATE-CITRIC ACID 500-334 MG/5ML PO SOLN: 30.0000 mL | ORAL | Status: DC | PRN

## 2015-12-24 MED ORDER — LIDOCAINE HCL (PF) 1 % IJ SOLN: 30.0000 mL | INTRAMUSCULAR | Status: DC | PRN

## 2015-12-24 MED ORDER — MAGNESIUM SULFATE BOLUS VIA INFUSION
4.0000 g | Freq: Once | INTRAVENOUS | Status: AC
  Administered 2015-12-24: 4 g via INTRAVENOUS
  Filled 2015-12-24: qty 500

## 2015-12-24 MED ORDER — LACTATED RINGERS IV SOLN: 500.0000 mL | INTRAVENOUS | Status: DC | PRN

## 2015-12-24 MED ORDER — OXYCODONE-ACETAMINOPHEN 5-325 MG PO TABS: 2.0000 | ORAL_TABLET | ORAL | Status: DC | PRN

## 2015-12-24 MED ORDER — ACETAMINOPHEN 325 MG PO TABS
650.0000 mg | ORAL_TABLET | ORAL | Status: DC | PRN
  Administered 2015-12-24 – 2015-12-26 (×4): 650 mg via ORAL
  Filled 2015-12-24 (×4): qty 2

## 2015-12-24 MED ORDER — OXYTOCIN 40 UNITS IN LACTATED RINGERS INFUSION - SIMPLE MED
2.5000 [IU]/h | INTRAVENOUS | Status: DC
  Filled 2015-12-24: qty 1000

## 2015-12-24 MED ORDER — MAGNESIUM SULFATE 50 % IJ SOLN
1.0000 g/h | INTRAVENOUS | Status: DC
  Administered 2015-12-25: 2 g/h via INTRAVENOUS
  Filled 2015-12-24 (×2): qty 80

## 2015-12-24 MED ORDER — OXYCODONE-ACETAMINOPHEN 5-325 MG PO TABS
1.0000 | ORAL_TABLET | ORAL | Status: DC | PRN
  Administered 2015-12-26: 1 via ORAL
  Filled 2015-12-24: qty 1

## 2015-12-24 MED ORDER — ONDANSETRON HCL 4 MG/2ML IJ SOLN
4.0000 mg | Freq: Four times a day (QID) | INTRAMUSCULAR | Status: DC | PRN
  Administered 2015-12-25 (×2): 4 mg via INTRAVENOUS
  Filled 2015-12-24 (×3): qty 2

## 2015-12-24 MED ORDER — LACTATED RINGERS IV SOLN
INTRAVENOUS | Status: DC
  Administered 2015-12-24 – 2015-12-25 (×5): via INTRAVENOUS

## 2015-12-24 MED ORDER — MISOPROSTOL 25 MCG QUARTER TABLET
25.0000 ug | ORAL_TABLET | ORAL | Status: DC | PRN
  Administered 2015-12-24 – 2015-12-25 (×5): 25 ug via VAGINAL
  Filled 2015-12-24 (×4): qty 0.25
  Filled 2015-12-24: qty 1
  Filled 2015-12-24 (×2): qty 0.25

## 2015-12-24 MED ORDER — TERBUTALINE SULFATE 1 MG/ML IJ SOLN: 0.2500 mg | Freq: Once | INTRAMUSCULAR | Status: DC | PRN

## 2015-12-24 MED ORDER — OXYTOCIN BOLUS FROM INFUSION
500.0000 mL | Freq: Once | INTRAVENOUS | Status: AC
  Administered 2015-12-26: 500 mL via INTRAVENOUS

## 2015-12-24 NOTE — H&P (Signed)
25 y.o. 2753w1d  G1P0000 presented today for office visit reporting that she had been vomiting and had nausea. Pt also c/o headaches and tingling on sides of head. She reported spotty and blurry vision.  She stated when she drives its almost like she can't see the whole road. Pt also c/o epigastric pain. Pt's BP 150/90 and 142/84 in office.  Due to patient's elevated BPs and neurologic sx Dr Mora ApplPinn discussed with pt the probability that she has preeclampsia with severe features. Pt was then sent to L&D as direct admission for induction of labor and magnesium sulfate for neuroprophylaxis.  Otherwise has good fetal movement and no bleeding.  Past Medical History:  Diagnosis Date  . Anxiety     Past Surgical History:  Procedure Laterality Date  . NO PAST SURGERIES      OB History  Gravida Para Term Preterm AB Living  1 0 0 0 0 0  SAB TAB Ectopic Multiple Live Births  0 0 0 0 0    # Outcome Date GA Lbr Len/2nd Weight Sex Delivery Anes PTL Lv  1 Current               Social History   Social History  . Marital status: Widowed    Spouse name: N/A  . Number of children: N/A  . Years of education: N/A   Occupational History  . Not on file.   Social History Main Topics  . Smoking status: Current Every Day Smoker    Packs/day: 0.25    Types: Cigarettes  . Smokeless tobacco: Never Used  . Alcohol use Yes  . Drug use:     Types: Marijuana  . Sexual activity: Yes    Birth control/ protection: Pill   Other Topics Concern  . Not on file   Social History Narrative  . No narrative on file   Review of patient's allergies indicates no known allergies.    Prenatal Transfer Tool  Maternal Diabetes: No Genetic Screening: Normal Maternal Ultrasounds/Referrals: Normal Fetal Ultrasounds or other Referrals:  None Maternal Substance Abuse:  Yes:  Type: Smoker Significant Maternal Medications:  None Significant Maternal Lab Results: None  Other PNC: smoker    Vitals:   12/24/15 1700  12/24/15 1800  BP: 137/90   Pulse: 96   Resp: 20 18  Temp:       Lungs/Cor:  NAD Abdomen:  soft, gravid Ex:  no cords, erythema SVE: 1/50/-2 FHTs:  135, good STV, NST R Toco:  irregular   A/P   Admitted for IOL for preeclampsia with severe features  GBS Neg  Cytotec for cervical ripening   NO3 or epidural for pain mgmt  Continuous monitoring  MgSO4 IV 4g bolus/2g maintenance until minimum 24 hrs post delivery  PEC labs sent.  Othwe routine care  CraigALLAHAN, [place]

## 2015-12-25 ENCOUNTER — Encounter (HOSPITAL_COMMUNITY): Payer: Self-pay | Admitting: Anesthesiology

## 2015-12-25 ENCOUNTER — Inpatient Hospital Stay (HOSPITAL_COMMUNITY): Admitting: Anesthesiology

## 2015-12-25 LAB — CBC
HEMATOCRIT: 35.3 % — AB (ref 36.0–46.0)
HEMATOCRIT: 37.1 % (ref 36.0–46.0)
HEMOGLOBIN: 12.6 g/dL (ref 12.0–15.0)
Hemoglobin: 12 g/dL (ref 12.0–15.0)
MCH: 29.7 pg (ref 26.0–34.0)
MCH: 30.1 pg (ref 26.0–34.0)
MCHC: 34 g/dL (ref 30.0–36.0)
MCHC: 34 g/dL (ref 30.0–36.0)
MCV: 87.4 fL (ref 78.0–100.0)
MCV: 88.5 fL (ref 78.0–100.0)
Platelets: 199 10*3/uL (ref 150–400)
Platelets: 233 10*3/uL (ref 150–400)
RBC: 4.04 MIL/uL (ref 3.87–5.11)
RBC: 4.19 MIL/uL (ref 3.87–5.11)
RDW: 13.8 % (ref 11.5–15.5)
RDW: 14 % (ref 11.5–15.5)
WBC: 9.4 10*3/uL (ref 4.0–10.5)
WBC: 11.8 10*3/uL — ABNORMAL HIGH (ref 4.0–10.5)

## 2015-12-25 LAB — RPR: RPR Ser Ql: NONREACTIVE

## 2015-12-25 MED ORDER — BUTORPHANOL TARTRATE 1 MG/ML IJ SOLN
INTRAMUSCULAR | Status: AC
  Filled 2015-12-25: qty 1

## 2015-12-25 MED ORDER — LIDOCAINE HCL (PF) 1 % IJ SOLN
INTRAMUSCULAR | Status: DC | PRN
  Administered 2015-12-25: 7 mL via EPIDURAL
  Administered 2015-12-25: 4 mL via EPIDURAL

## 2015-12-25 MED ORDER — NALBUPHINE HCL 10 MG/ML IJ SOLN
10.0000 mg | Freq: Once | INTRAMUSCULAR | Status: AC
  Administered 2015-12-25: 10 mg via INTRAVENOUS
  Filled 2015-12-25: qty 1

## 2015-12-25 MED ORDER — PHENYLEPHRINE 40 MCG/ML (10ML) SYRINGE FOR IV PUSH (FOR BLOOD PRESSURE SUPPORT)
80.0000 ug | PREFILLED_SYRINGE | INTRAVENOUS | Status: DC | PRN
Start: 2015-12-25 — End: 2015-12-26
  Filled 2015-12-25: qty 5
  Filled 2015-12-25: qty 10

## 2015-12-25 MED ORDER — NALBUPHINE HCL 10 MG/ML IJ SOLN
10.0000 mg | Freq: Once | INTRAMUSCULAR | Status: AC
  Administered 2015-12-25: 10 mg via INTRAMUSCULAR
  Filled 2015-12-25: qty 1

## 2015-12-25 MED ORDER — PROMETHAZINE HCL 25 MG/ML IJ SOLN
12.5000 mg | Freq: Once | INTRAMUSCULAR | Status: AC
  Administered 2015-12-25: 12.5 mg via INTRAVENOUS
  Filled 2015-12-25: qty 1

## 2015-12-25 MED ORDER — PHENYLEPHRINE 40 MCG/ML (10ML) SYRINGE FOR IV PUSH (FOR BLOOD PRESSURE SUPPORT)
80.0000 ug | PREFILLED_SYRINGE | INTRAVENOUS | Status: DC | PRN
  Administered 2015-12-25: 80 ug via INTRAVENOUS
  Administered 2015-12-25: 40 ug via INTRAVENOUS
  Filled 2015-12-25: qty 5

## 2015-12-25 MED ORDER — PHENYLEPHRINE 40 MCG/ML (10ML) SYRINGE FOR IV PUSH (FOR BLOOD PRESSURE SUPPORT)
PREFILLED_SYRINGE | INTRAVENOUS | Status: AC
  Filled 2015-12-25: qty 20

## 2015-12-25 MED ORDER — NALBUPHINE HCL 10 MG/ML IJ SOLN
10.0000 mg | Freq: Once | INTRAMUSCULAR | Status: AC
Start: 2015-12-25 — End: 2015-12-25
  Administered 2015-12-25: 10 mg via INTRAMUSCULAR
  Filled 2015-12-25: qty 1

## 2015-12-25 MED ORDER — LIDOCAINE-EPINEPHRINE (PF) 2 %-1:200000 IJ SOLN
INTRAMUSCULAR | Status: DC | PRN
  Administered 2015-12-25: 5 mg via INTRADERMAL

## 2015-12-25 MED ORDER — OXYTOCIN 40 UNITS IN LACTATED RINGERS INFUSION - SIMPLE MED
1.0000 m[IU]/min | INTRAVENOUS | Status: DC
  Administered 2015-12-25: 6 m[IU]/min via INTRAVENOUS
  Administered 2015-12-25: 12 m[IU]/min via INTRAVENOUS
  Administered 2015-12-25: 8 m[IU]/min via INTRAVENOUS
  Administered 2015-12-25: 10 m[IU]/min via INTRAVENOUS
  Administered 2015-12-25: 14 m[IU]/min via INTRAVENOUS
  Administered 2015-12-25: 2 m[IU]/min via INTRAVENOUS

## 2015-12-25 MED ORDER — EPHEDRINE 5 MG/ML INJ
10.0000 mg | INTRAVENOUS | Status: DC | PRN
  Filled 2015-12-25: qty 4

## 2015-12-25 MED ORDER — FENTANYL 2.5 MCG/ML BUPIVACAINE 1/10 % EPIDURAL INFUSION (WH - ANES)
INTRAMUSCULAR | Status: DC
Start: 2015-12-25 — End: 2015-12-25
  Filled 2015-12-25: qty 125

## 2015-12-25 MED ORDER — LACTATED RINGERS IV SOLN
500.0000 mL | Freq: Once | INTRAVENOUS | Status: AC
  Administered 2015-12-25: 500 mL via INTRAVENOUS

## 2015-12-25 MED ORDER — BUTORPHANOL TARTRATE 1 MG/ML IJ SOLN
1.0000 mg | INTRAMUSCULAR | Status: DC | PRN
  Administered 2015-12-25: 1 mg via INTRAVENOUS

## 2015-12-25 MED ORDER — DIPHENHYDRAMINE HCL 50 MG/ML IJ SOLN
12.5000 mg | INTRAMUSCULAR | Status: DC | PRN
  Filled 2015-12-25: qty 1

## 2015-12-25 MED ORDER — TERBUTALINE SULFATE 1 MG/ML IJ SOLN: 0.2500 mg | Freq: Once | INTRAMUSCULAR | Status: DC | PRN

## 2015-12-25 MED ORDER — FENTANYL 2.5 MCG/ML BUPIVACAINE 1/10 % EPIDURAL INFUSION (WH - ANES)
14.0000 mL/h | INTRAMUSCULAR | Status: DC | PRN
  Administered 2015-12-25: 14 mL/h via EPIDURAL
  Filled 2015-12-25: qty 125

## 2015-12-25 NOTE — Anesthesia Pain Management Evaluation Note (Signed)
  CRNA Pain Management Visit Note  Patient: Jane Gould, 25 y.o., female  "Hello [facility]. We have an anesthesia team available at all times to provide care throughout the hospital, including epidural management and anesthesia for C-section. I don't know your plan for the delivery whether it a natural birth, water birth, IV sedation, nitrous supplementation, doula or epidural, but we want to meet your pain goals."   1.Was your pain managed to your expectations on prior hospitalizations?   No   2.What is your expectation for pain management during this hospitalization?     Epidural  3.How can we help you reach that goal? unsure  Record the patient's initial score and the patient's pain goal.   Pain: 5  Pain Goal: 5 [facility] wants you to be able to say your pain was always managed very well.  Cephus ShellingBURGER,Abdulah Iqbal 12/25/2015

## 2015-12-25 NOTE — Progress Notes (Signed)
Medicated with IV medication Cytotec placed.

## 2015-12-25 NOTE — Progress Notes (Signed)
Oleva L Selena BattenKim is a 25 y.o. G1P0000 at 8731w2d by LMP admitted for induction of labor due to Pre-eclamptic toxemia of pregnancy..  Subjective: Patient starting to get uncomfortable with contraction pain  Objective: BP 112/72   Pulse 84   Temp 97.8 F (36.6 C) (Oral)   Resp 16   Ht 5\' 5"  (1.651 m)   Wt 92.5 kg (204 lb)   LMP 04/01/2015   SpO2 98%   BMI 33.95 kg/m  I/O last 3 completed shifts: In: 5781.3 [P.O.:2220; I.V.:3561.3] Out: 7550 [Urine:7550] No intake/output data recorded.  FHT:  FHR: 125 bpm, variability: moderate,  accelerations:  Present,  decelerations:  Absent UC:   regular, every 3 minutes SVE:   Dilation: 4 Effacement (%): 60 Station: -2 Exam by:: Dr. Mora ApplPinn  AROM: Clear fluid  Labs: Lab Results  Component Value Date   WBC 9.4 12/25/2015   HGB 12.0 12/25/2015   HCT 35.3 (L) 12/25/2015   MCV 87.4 12/25/2015   PLT 199 12/25/2015    Assessment / Plan: Induction of labor due to preeclampsia.  Patient now transitioning into active labor  Labor: Continue Pitocin augmentation Preeclampsia:  on magnesium sulfate, no signs or symptoms of toxicity, intake and ouput balanced and labs stable Fetal Wellbeing:  Category I Pain Control:  IV pain meds I/D:  n/a Anticipated MOD:  NSVD  Annalicia Renfrew STACIA 12/25/2015, 8:48 PM

## 2015-12-25 NOTE — Anesthesia Preprocedure Evaluation (Signed)
Anesthesia Evaluation  Patient identified by MRN, date of birth, ID band Patient awake    Reviewed: Allergy & Precautions, H&P , NPO status , Patient's Chart, lab work & pertinent test results  Airway Mallampati: II  TM Distance: >3 FB Neck ROM: full    Dental no notable dental hx.    Pulmonary neg pulmonary ROS, Current Smoker,    Pulmonary exam normal        Cardiovascular Normal cardiovascular exam     Neuro/Psych negative neurological ROS     GI/Hepatic negative GI ROS, Neg liver ROS,   Endo/Other  negative endocrine ROS  Renal/GU negative Renal ROS     Musculoskeletal   Abdominal (+) + obese,   Peds  Hematology negative hematology ROS (+)   Anesthesia Other Findings   Reproductive/Obstetrics (+) Pregnancy                             Anesthesia Physical Anesthesia Plan  ASA: II  Anesthesia Plan: Epidural   Post-op Pain Management:    Induction:   Airway Management Planned:   Additional Equipment:   Intra-op Plan:   Post-operative Plan:   Informed Consent: I have reviewed the patients History and Physical, chart, labs and discussed the procedure including the risks, benefits and alternatives for the proposed anesthesia with the patient or authorized representative who has indicated his/her understanding and acceptance.     Plan Discussed with:   Anesthesia Plan Comments:         Anesthesia Quick Evaluation

## 2015-12-25 NOTE — Anesthesia Procedure Notes (Signed)
Epidural Patient location during procedure: OB Start time: 12/25/2015 9:34 PM End time: 12/25/2015 9:40 PM  Staffing Anesthesiologist: Leilani AbleHATCHETT, Gisel Vipond Performed: anesthesiologist   Preanesthetic Checklist Completed: patient identified, surgical consent, pre-op evaluation, timeout performed, IV checked, risks and benefits discussed and monitors and equipment checked  Epidural Patient position: sitting Prep: site prepped and draped and DuraPrep Patient monitoring: continuous pulse ox and blood pressure Approach: midline Location: L3-L4 Injection technique: LOR air  Needle:  Needle type: Tuohy  Needle gauge: 17 G Needle length: 9 cm and 9 Needle insertion depth: 7 cm Catheter type: closed end flexible Catheter size: 19 Gauge Catheter at skin depth: 12 cm Test dose: negative and Other  Assessment Sensory level: T10 Events: blood not aspirated, injection not painful, no injection resistance, negative IV test and no paresthesia  Additional Notes Reason for block:procedure for pain

## 2015-12-25 NOTE — Progress Notes (Signed)
Jane Gould is a 25 y.o. G1P0000 at 3222w2d by LMP admitted for induction of labor due to Pre-eclamptic toxemia of pregnancy..  Subjective: Patient starting to feel pain from contractions.  She notes her headache has resolved, but the blurry vision has persisted.   Objective: BP 124/82   Pulse 79   Temp 98 F (36.7 C) (Oral)   Resp 16   Ht 5\' 5"  (1.651 m)   Wt 92.5 kg (204 lb)   LMP 04/01/2015   SpO2 98%   BMI 33.95 kg/m  I/O last 3 completed shifts: In: 4228.3 [P.O.:2220; I.V.:2008.3] Out: 3700 [Urine:3700] Total I/O In: 375 [I.V.:375] Out: 350 [Urine:350]  FHT:  FHR: 145 bpm, variability: moderate,  accelerations:  Present,  decelerations:  Absent UC:   irregular, every 4-5 minutes SVE:   Dilation: 1 Effacement (%): 50 Station: -3 Exam by:: Campbell SoupS Earl RNC  Labs: Lab Results  Component Value Date   WBC 11.6 (H) 12/24/2015   HGB 11.8 (L) 12/24/2015   HCT 34.5 (L) 12/24/2015   MCV 87.3 12/24/2015   PLT 207 12/24/2015    Assessment / Plan: Induction of labor due to preeclampsia with severe features.  Ripening of cervix continues with Misoprostol  Labor: Latent labor Preeclampsia:  on magnesium sulfate, intake and ouput balanced and labs stable Fetal Wellbeing:  Category I Pain Control:  IV pain meds and Nitrous Oxide I/D:  n/a Anticipated MOD:  NSVD  Jane Gould STACIA 12/25/2015, 10:55 AM

## 2015-12-25 NOTE — Progress Notes (Signed)
CSW acknowledges consult and will meet with patient after L&D.   Ovid Witman Boyd-Gilyard, MSW, LCSW Clinical Social Work [phone]  

## 2015-12-25 NOTE — Progress Notes (Signed)
Dr Mora ApplPinn updated on SVE.  Will plan to place a Foley/low dose pitocin protocol. Reviewed possible POC with pt and family.  Answered questions.  Pt in agreement with POC.

## 2015-12-26 ENCOUNTER — Encounter (HOSPITAL_COMMUNITY): Payer: Self-pay | Admitting: *Deleted

## 2015-12-26 LAB — COMPREHENSIVE METABOLIC PANEL
ALT: 10 U/L — AB (ref 14–54)
AST: 26 U/L (ref 15–41)
Albumin: 2.6 g/dL — ABNORMAL LOW (ref 3.5–5.0)
Alkaline Phosphatase: 144 U/L — ABNORMAL HIGH (ref 38–126)
Anion gap: 7 (ref 5–15)
BUN: 7 mg/dL (ref 6–20)
CALCIUM: 7.3 mg/dL — AB (ref 8.9–10.3)
CHLORIDE: 102 mmol/L (ref 101–111)
CO2: 24 mmol/L (ref 22–32)
Creatinine, Ser: 0.78 mg/dL (ref 0.44–1.00)
Glucose, Bld: 166 mg/dL — ABNORMAL HIGH (ref 65–99)
Potassium: 3.9 mmol/L (ref 3.5–5.1)
Sodium: 133 mmol/L — ABNORMAL LOW (ref 135–145)
Total Bilirubin: 0.6 mg/dL (ref 0.3–1.2)
Total Protein: 5.1 g/dL — ABNORMAL LOW (ref 6.5–8.1)

## 2015-12-26 LAB — CBC
HEMATOCRIT: 30.7 % — AB (ref 36.0–46.0)
HEMOGLOBIN: 10.2 g/dL — AB (ref 12.0–15.0)
MCH: 30.2 pg (ref 26.0–34.0)
MCHC: 33.2 g/dL (ref 30.0–36.0)
MCV: 90.8 fL (ref 78.0–100.0)
PLATELETS: 190 10*3/uL (ref 150–400)
RBC: 3.38 MIL/uL — AB (ref 3.87–5.11)
RDW: 13.8 % (ref 11.5–15.5)
WBC: 14.8 10*3/uL — AB (ref 4.0–10.5)

## 2015-12-26 MED ORDER — COCONUT OIL OIL
1.0000 "application " | TOPICAL_OIL | Status: DC | PRN
  Filled 2015-12-26: qty 120

## 2015-12-26 MED ORDER — MISOPROSTOL 200 MCG PO TABS
ORAL_TABLET | ORAL | Status: AC
  Filled 2015-12-26: qty 1

## 2015-12-26 MED ORDER — OXYCODONE HCL 5 MG PO TABS: 5.0000 mg | ORAL_TABLET | ORAL | Status: DC | PRN

## 2015-12-26 MED ORDER — DIBUCAINE 1 % RE OINT: 1.0000 "application " | TOPICAL_OINTMENT | RECTAL | Status: DC | PRN

## 2015-12-26 MED ORDER — ACETAMINOPHEN 325 MG PO TABS: 650.0000 mg | ORAL_TABLET | ORAL | Status: DC | PRN

## 2015-12-26 MED ORDER — PRENATAL MULTIVITAMIN CH
1.0000 | ORAL_TABLET | Freq: Every day | ORAL | Status: DC
  Administered 2015-12-26 – 2015-12-28 (×3): 1 via ORAL
  Filled 2015-12-26 (×3): qty 1

## 2015-12-26 MED ORDER — DIPHENHYDRAMINE HCL 25 MG PO CAPS: 25.0000 mg | ORAL_CAPSULE | Freq: Four times a day (QID) | ORAL | Status: DC | PRN

## 2015-12-26 MED ORDER — ZOLPIDEM TARTRATE 5 MG PO TABS: 5.0000 mg | ORAL_TABLET | Freq: Every evening | ORAL | Status: DC | PRN

## 2015-12-26 MED ORDER — DIPHENOXYLATE-ATROPINE 2.5-0.025 MG PO TABS
2.0000 | ORAL_TABLET | Freq: Four times a day (QID) | ORAL | Status: DC | PRN
  Administered 2015-12-26: 2 via ORAL
  Filled 2015-12-26: qty 2

## 2015-12-26 MED ORDER — TETANUS-DIPHTH-ACELL PERTUSSIS 5-2.5-18.5 LF-MCG/0.5 IM SUSP
0.5000 mL | Freq: Once | INTRAMUSCULAR | Status: DC
  Filled 2015-12-26: qty 0.5

## 2015-12-26 MED ORDER — MISOPROSTOL 200 MCG PO TABS
ORAL_TABLET | ORAL | Status: AC
  Filled 2015-12-26: qty 5

## 2015-12-26 MED ORDER — MISOPROSTOL 200 MCG PO TABS: 1000.0000 ug | ORAL_TABLET | Freq: Once | ORAL | Status: DC

## 2015-12-26 MED ORDER — ONDANSETRON HCL 4 MG PO TABS: 4.0000 mg | ORAL_TABLET | ORAL | Status: DC | PRN

## 2015-12-26 MED ORDER — SENNOSIDES-DOCUSATE SODIUM 8.6-50 MG PO TABS
2.0000 | ORAL_TABLET | ORAL | Status: DC
  Administered 2015-12-27 (×2): 2 via ORAL
  Filled 2015-12-26 (×2): qty 2

## 2015-12-26 MED ORDER — SIMETHICONE 80 MG PO CHEW: 80.0000 mg | CHEWABLE_TABLET | ORAL | Status: DC | PRN

## 2015-12-26 MED ORDER — CARBOPROST TROMETHAMINE 250 MCG/ML IM SOLN
250.0000 ug | Freq: Once | INTRAMUSCULAR | Status: AC
  Administered 2015-12-26: 250 ug via INTRAMUSCULAR

## 2015-12-26 MED ORDER — IBUPROFEN 600 MG PO TABS
600.0000 mg | ORAL_TABLET | Freq: Four times a day (QID) | ORAL | Status: DC
  Administered 2015-12-26 – 2015-12-28 (×10): 600 mg via ORAL
  Filled 2015-12-26 (×10): qty 1

## 2015-12-26 MED ORDER — OXYCODONE HCL 5 MG PO TABS
10.0000 mg | ORAL_TABLET | ORAL | Status: DC | PRN
Start: 2015-12-26 — End: 2015-12-28

## 2015-12-26 MED ORDER — WITCH HAZEL-GLYCERIN EX PADS: 1.0000 "application " | MEDICATED_PAD | CUTANEOUS | Status: DC | PRN

## 2015-12-26 MED ORDER — BENZOCAINE-MENTHOL 20-0.5 % EX AERO
1.0000 "application " | INHALATION_SPRAY | CUTANEOUS | Status: DC | PRN
  Filled 2015-12-26: qty 56

## 2015-12-26 MED ORDER — ONDANSETRON HCL 4 MG/2ML IJ SOLN
4.0000 mg | INTRAMUSCULAR | Status: DC | PRN
  Administered 2015-12-26: 4 mg via INTRAVENOUS

## 2015-12-26 MED ORDER — OXYTOCIN 40 UNITS IN LACTATED RINGERS INFUSION - SIMPLE MED: 2.5000 [IU]/h | INTRAVENOUS | Status: DC | PRN

## 2015-12-26 NOTE — Progress Notes (Addendum)
Patient is eating, ambulating, voiding.  Pain control is good.  Pt's vision returned to normal.  No other preeclampia symptoms this am.   12/26/15 0351 98.5 F (36.9 C) 79 -- 16 136/80 High-fowlers -- -- -- KM      Fundus firm Perineum without swelling.  Lab Results  Component Value Date   WBC 14.8 (H) 12/26/2015   HGB 10.2 (L) 12/26/2015   HCT 30.7 (L) 12/26/2015   MCV 90.8 12/26/2015   PLT 190 12/26/2015    --/--/A POS, A POS (10/24 1525)/RI  A/P Post partum day 0.  Preeclampsia with severe features- on Magnesium sulfate for at least 12 hours.  Will watch BPs- currently stable pp.  Also had pp hemorrhage but H/H is stable this am.    Routine care besides.  Expect d/c routine, no earlier.  Ambulate with assistance.   Jane Gould A

## 2015-12-26 NOTE — Progress Notes (Signed)
At 2240 Patient complaining of nausea and pain after epidural placement. When asking patient to describe pain, patient stated "it feels the same as earlier". This RN asked patient if she thought that the pain felt more like pain or like pressure, patient stated "its the same as before, you arent listening to me, you keep asking me the same thing over and over". This RN explained to patient that I was asking questions in order to learn more information about how she was feeling so I could decide the best way of treating her pain. Patient verbalized understanding at this time. This RN checked the patient's cervix and explained to patient how to use PCA feature on epidural. Patient requesting phenergan for nausea control. This RN explained to patient that zofran was ordered and because of the recent bolus from her epidural the nausea could be from her blood pressure dropping so I needed to take a blood pressure. Patient appeared irritated, stated " I don't care about the explanation, just do whatever." Patient given zofran and repositioned at this time. Offered condolences to patient and explained that nausea can be a normal part of the labor process. Patient verbalized understanding. When asking if patient had further questions or needs patient stated " I just want to be left alone, I need you to leave". This RN asked patient if she would like ginger ale for nausea and left room.  At 2328, variable noted in FHR pattern and patient repositioned. Explained to patient and family how repositioning can improve heart rate and the meanings of the different kinds of decelerations. Family verbalized understanding. FHR again dropped and I explained to patient that we needed to turn her to the right side and turn off the pitocin to improve FHR. While turning, patient asked if Dr. Mora ApplPinn had been notified about the drop in FHR. Explained to patient that my first priority was to improve the FHR and then I would notify Dr. Mora ApplPinn when  if I felt it was necessary. Patient became irate, and expressed concern that Dr. Mora ApplPinn had not been notified. Again, I explained to patient that decelerations can happen during labor and that I was doing everything I could to care for her and her baby at that time. Patient cursing frequently and demanding that doctor be notified at this moment. Explained to patient that I will have another RN call Dr. Mora ApplPinn at this time but my primary concern was improving the FHR. Patient talking over RN stating she would like a new nurse. This RN verbalized understanding and stated that after I checked her cervix I would find another RN to take care of her. At this time Stoney BangNatalie Deal, RN and Margarita RanaEmily Siska, RN entered room and Dr. Mora ApplPinn called and notified of patients status and desire to be evaluated by MD in person. I asked patient if she would feel more comfortable being checked by another RN as there was one available at the bedside and patient said yes. This RN exited the room at this time and notified Tracie HarrierBeka Harker, RN of patient's desire for new nurse.

## 2015-12-26 NOTE — Lactation Note (Signed)
This note was copied from a baby's chart. Lactation Consultation Note  Patient Name: Jane Gould: 12/26/2015 Reason for consult: Initial assessment Baby at 8 hr of life. Baby at 38+3, SGA 5 lb 3.8oz. Mom admitts to smoking 5 cigarettes/24hr during pregnancy. Mom stated baby has been cluster feeding since birth. Lactation observed baby taking 2-3 sucks then falling asleep at the breast. Baby does latch deeply, but is very sleepy. Demonstrated manual expression, easily expressed colostrum noted bilaterally, spoons in room. Parents were taught to spoon feed with the 3ml that mom was able to manually express post pumping. Baby actively sucked milk off the spoon, and tolerated EBM well. Mom admits to be worried about bf her baby. At times mom became agitated. Baby re weighed, at 0.7% wt loss since birth. Even though the baby is term, LPT infant information given to parents due to baby's size. Parents understand that their baby is not preterm but could act/benefit from LPT infant guidelines. Set up DEBP for use q3hr around the clock in addition to manual expression. Reviewed supplementing LPT infant supplementing volume guidelines. Spoke with Dr. Chanetta Marshallimberlake about starting Alimentum, she will f/u with Dr. Margo AyeHall and get back to lactation. Mom was very tired and more bf education needs to be done. The goal at this visit was to get baby fed and for mom to take a nap. FOB at bedside and supportive. Mom was very pleased to see that she has colostrum to feed baby. Given lactation handouts. Aware of OP services and support group. Mom to offer breast 8+/24hr on demand, post pump, post manually express, and offer her milk per LPT infant supplementing guidelines. Mom is aware that formula may be needed if she is unable to express the volume needed. She will call for help as needed.        Maternal Data Formula Feeding for Exclusion: No Has patient been taught Hand Expression?: Yes Does the patient have  breastfeeding experience prior to this delivery?: No  Feeding Feeding Type: Breast Fed Length of feed: 2 min (baby at the breast 10 minutes, only a few soft suckles)  LATCH Score/Interventions                      Lactation Tools Discussed/Used     Consult Status      Rulon Eisenmengerlizabeth E Cloys Vera 12/26/2015, 11:12 AM

## 2015-12-26 NOTE — Anesthesia Postprocedure Evaluation (Signed)
Anesthesia Post Note  Patient: [name]  Procedure(s) Performed: * No procedures listed *  Patient location during evaluation: Mother Baby Anesthesia Type: Epidural Level of consciousness: awake and alert and oriented Pain management: satisfactory to patient Vital Signs Assessment: post-procedure vital signs reviewed and stable Respiratory status: spontaneous breathing and nonlabored ventilation Cardiovascular status: stable Postop Assessment: no headache, no backache, no signs of nausea or vomiting, adequate PO intake and patient able to bend at knees (patient up walking) Anesthetic complications: no     Last Vitals:  Vitals:   12/26/15 0600 12/26/15 0658  BP:    Pulse:    Resp: 16 16  Temp:      Last Pain:  Vitals:   12/26/15 0358  TempSrc:   PainSc: 4    Pain Goal: Patients Stated Pain Goal: 8 (12/24/15 1530)               Madison HickmanGREGORY,Bless Lisenby

## 2015-12-26 NOTE — Lactation Note (Signed)
This note was copied from a baby's chart. Lactation Consultation Note  Patient Name: Jane Gould NFAOZ'HToday's Date: 12/26/2015 Reason for consult: Follow-up assessment;Infant < 6lbs Mom called to ask questions.  Nursery RN was present assisting with hand expression and spoon feeding.  Baby took 2 mls easily.  Mom expresses concern baby is sleepy and not showing interest in feeding.  She also voices the importance of breastfeeding her baby.  Assured mom that we are here to help her meet her goals.  We discussed sleepiness in the first 24 hours and sometimes cluster feeding on day 2-3.  Mom is comfortable with hand expression and spoon feeding for now.  I also recommended pumping along with hand expression.  I did explain if volume of breastmilk expressed is not sufficient along with no feeding from the breast we would need to add formula.  Mom voices understanding.  Encouraged to call out for assist/concernsprn.  Maternal Data    Feeding Feeding Type: Breast Fed  LATCH Score/Interventions Latch: Too sleepy or reluctant, no latch achieved, no sucking elicited. Intervention(s): Skin to skin  Audible Swallowing: None Intervention(s): Hand expression  Type of Nipple: Everted at rest and after stimulation  Comfort (Breast/Nipple): Soft / non-tender     Hold (Positioning): Full assist, staff holds infant at breast  LATCH Score: 4  Lactation Tools Discussed/Used Tools: Pump (spoon)   Consult Status Consult Status: Follow-up Date: 12/27/15 Follow-up type: In-patient    Huston FoleyMOULDEN, Jabir Dahlem S 12/26/2015, 5:31 PM

## 2015-12-27 NOTE — Lactation Note (Signed)
This note was copied from a baby'Gould chart. Lactation Consultation Note  Patient Name: Jane Lawson RadarCalli Valcarcel ZOXWR'UToday'Gould Date: Gould Reason for consult: Follow-up assessment;Infant < 6lbs Follow up visit made.  Mom just pumped and obtained 12 mls.  She is attempting to give expressed milk by finger feeding but baby sleepy.  I assisted with postioning baby in football hold.  Mom can easily hand express milk.  Baby not cueing and only a few sucks elicited.  Assisted mom with bottle feeding.  Baby took feeding slowly and remained sleepy.  Mom will continue to put baby to breast with cues and call for [facility] assist as needed, pump both breasts every 2-3 hours and bottle feed 20 mls of expressed milk/formula.  Maternal Data    Feeding Feeding Type: Breast Fed Length of feed: 5 min  LATCH Score/Interventions Latch: Repeated attempts needed to sustain latch, nipple held in mouth throughout feeding, stimulation needed to elicit sucking reflex. Intervention(Gould): Skin to skin;Teach feeding cues;Waking techniques Intervention(Gould): Breast compression;Breast massage;Assist with latch;Adjust position  Audible Swallowing: None Intervention(Gould): Skin to skin;Hand expression;Alternate breast massage  Type of Nipple: Everted at rest and after stimulation  Comfort (Breast/Nipple): Soft / non-tender     Hold (Positioning): Assistance needed to correctly position infant at breast and maintain latch. Intervention(Gould): Breastfeeding basics reviewed;Support Pillows;Position options;Skin to skin  LATCH Score: 6  Lactation Tools Discussed/Used     Consult Status Consult Status: Follow-up Date: 12/28/15 Follow-up type: In-patient    Huston FoleyMOULDEN, Jane Gould Gould, 10:09 AM

## 2015-12-27 NOTE — Clinical Social Work Maternal (Signed)
CLINICAL SOCIAL WORK MATERNAL/CHILD NOTE  Patient Details  Name: Jane Gould MRN: [id number] Date of Birth: 03-20-1990  Date:  12/27/2015  Clinical Social Worker Initiating Note:  Randeep Biondolillo E. Brigitte Pulse, [place] Date/ Time Initiated:  12/27/15/1000     Child's Name:  Jane Gould   Legal Guardian:  Other (Comment) (Parents: Jane Gould and Jane Gould)   Need for Interpreter:  None   Date of Referral:  12/27/15     Reason for Referral:  Other (Comment) (Anxiety, hx THC and familial issues.)   Referral Source:  [facility]   Address:  [address] [place], [place] [address]  Phone number:  [phone]   Household Members:  Significant Other (MOB states she lives with her boyfriend/Jane Gould.  )   Natural Supports (not living in the home):  Parent (MOB reports that she has a good support system.  She reports a positive relationship with her boyfriend.  She became tearful and said that her parents have "really stepped up" and haven't always been there for her in the past, but feels they are very s  )   Professional Supports: None   Employment:     Type of Work:     Education:      Pensions consultant:  Medicaid, Other(Comment) (MOB has Building control surveyor.  Baby will not have Tricare.)   Other Resources:      Cultural/Religious Considerations Which May Impact Care: None stated.    Strengths:  Ability to meet basic needs , Compliance with medical plan , Pediatrician chosen , Home prepared for child  (Pediatric follow up will be at [facility])   Risk Factors/Current Problems:  Mental Health Concerns  (Anxiety)   Cognitive State:  Able to Concentrate , Alert , Insightful , Linear Thinking , Goal Oriented    Mood/Affect:  Comfortable , Calm , Interested , Happy    CSW Assessment: CSW met with MOB in her first floor room/156 to offer support and complete assessment due to history of anxiety and marijuana use and reports of issues between FOB and MOB's  boyfriend.  MOB was extremely pleasant and inviting of CSW's visit.  CSW found her to be easy to engage. MOB states she will be staying in the hospital another night, which she feels comfortable with.  MOB appears anxious about caring for baby and states she is glad to have the support of lactation here in the hospital.  She states she is feeling better physically and notes no current emotional concerns. MOB explained that her current boyfriend/Jane is not the FOB and that he is involved, supportive and accepting of the situation.  She states they were involved in [employer] and got back together after she found out she was pregnant.  MOB reports that she was in [place], [place] for school and met FOB/Jane Gould.  She states he was here for delivery as planned, but came to the hospital under the influence of alcohol and was belligerent, so he was asked to leave and not come back.  MOB reports that she feels very embarrassed by this.  MOB reports that she is a widow.  Her husband died in [place] in Apr 15, 2009 when she was 25 years old.  She added, "it's been a crazy life." MOB reports anxiety since her husband's death.  She states she took Xanax at the time and was able to decrease the amount she took.  She states she had counseling initially, but mostly, "just dealt with it."  She appears anxious about being a new mother, especially where breast feeding and baby's sleep are concerned.  CSW discussed healthy fears and normalized hypervigilance in the beginning, but also spoke at length regarding perinatal mood disorders, treatment is PMAD is identified, and SIDS precautions in order to understand how to put baby to bed safely.  MOB was engaged and attentive and asked appropriate questions.  She states she feels comfortable talking with her doctor if symptoms arise or if she has concerns at any time.  CSW provided her with a maternal mental health checklist as a way to monitor and have a discussion with her doctor if needed.   MOB seemed very appreciative of the information and for CSW's concern for her emotional wellbeing. CSW inquired about hx of marijuana use.  MOB reports that she smoked marijuana prior to pregnancy and has no plans to use moving forward.  CSW explained hospital drug screen policy, given that date of use was not documented.  MOB was understanding and states no concerns.   MOB had questions regarding Medicaid for baby, as she states she has Tricare from her husband, but that since the baby is not his, the baby is not eligible for Tricare coverage.  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  CSW contacted N. McCraw/financial counseling who states she will send off the Medicaid application for baby.  MOB states understanding that she will need to reapply before 12/30/16.     CSW Plan/Description:  No Further Intervention Required/No Barriers to Discharge, Patient/Family Education     Alphonzo Cruise,  12/27/2015, 2:33 PM

## 2015-12-27 NOTE — Progress Notes (Signed)
Patient is doing well.  She is ambulating, voiding, tolerating PO.  Pain control is good.  Lochia is appropriate Magnesium was discontinued at 1500 yesterday (12 h PP) Denies HA/BV/RUQ pain.   Vitals:   12/26/15 1620 12/26/15 2000 12/27/15 0000 12/27/15 0600  BP: 125/85 125/71 125/66 (!) 145/81  Pulse: 70 68 73 79  Resp: 16 18 18 18   Temp: 98.6 F (37 C) 98.4 F (36.9 C) 98.1 F (36.7 C) 97.9 F (36.6 C)  TempSrc: Oral Oral Oral Oral  SpO2:      Weight:      Height:        NAD Fundus firm Ext: 1+ edema bilateral  Lab Results  Component Value Date   WBC 14.8 (H) 12/26/2015   HGB 10.2 (L) 12/26/2015   HCT 30.7 (L) 12/26/2015   MCV 90.8 12/26/2015   PLT 190 12/26/2015    --/--/A POS, A POS (10/24 1525)/RImmune  A/P 25 y.o. G1P1001 PPD#1 s/p IOL at 38w for severe preE. Severe preE by symptoms (HA, visual changes).  BP has never been severe range or required antihypertensive therapy --s/p 12h postpartum magnesium.  Asymptomatic.  BPs mild range.  Will monitor in house for at least an additional 24h Expect d/c in 1-2d.    [facility] Health Medical CenterDYANNA GEFFEL [employer] CompanyCLARK

## 2015-12-27 NOTE — Lactation Note (Signed)
This note was copied from a baby's chart. Lactation Consultation Note Mom called for assistance BF. Assisted mom in BF in football position. Mom was trying to BF on cross cradle position, didn't have any pillows under her arms for support. Baby BF well in football position. Discussed comfort during BF, position options, breast massage, and supplementation after BF. Mom is going to post pump every 3 hrs. for 15 min. Mom has used DEBP,pumped 2ml colostrum. Hand expressed 3ml colostrum. Discussed supplementing after BF baby. Gave feeding supplementation sheet. Although baby is 38 weeks, she is 5.0lbs. Also gave LPI information sheet to follow for supplementing d/t getting more calories, and good information to conserve calories. Mom plans to breast/ bottle feed BM. Will give supplementation at this time w/curve tip syring since small amounts. When increased will give supplementation in bottle. Finger fed w/curve tip syring 5ml colostrum.  Mom has good everted nipples, expressed colostrum per mom. Mom has generalized edema. Breast heavy, mom stated they are swollen as well. Encouraged mom to assess breast before and after BF to note transfer of colostrum.  Encouraged mom to give baby rest breaks while BF, as well as keeping BF time no more than 30 min.  Patient Name: Jane Gould ZOXWR'UToday's Date: 12/27/2015 Reason for consult: Follow-up assessment;Infant < 6lbs   Maternal Data    Feeding Feeding Type: Breast Fed Length of feed: 20 min  LATCH Score/Interventions Latch: Repeated attempts needed to sustain latch, nipple held in mouth throughout feeding, stimulation needed to elicit sucking reflex. Intervention(s): Skin to skin;Teach feeding cues;Waking techniques Intervention(s): Adjust position;Assist with latch;Breast massage;Breast compression  Audible Swallowing: A few with stimulation Intervention(s): Skin to skin;Hand expression;Alternate breast massage  Type of Nipple: Everted at rest and  after stimulation  Comfort (Breast/Nipple): Soft / non-tender     Hold (Positioning): Assistance needed to correctly position infant at breast and maintain latch. Intervention(s): Breastfeeding basics reviewed;Support Pillows;Position options;Skin to skin  LATCH Score: 7  Lactation Tools Discussed/Used Tools: Pump Breast pump type: Double-Electric Breast Pump   Consult Status Consult Status: Follow-up Date: 12/27/15 (in pm) Follow-up type: In-patient    Charyl DancerCARVER, Karianna Gusman G 12/27/2015, 12:51 AM

## 2015-12-28 LAB — TYPE AND SCREEN
ABO/RH(D): A POS
Antibody Screen: NEGATIVE
UNIT DIVISION: 0
Unit division: 0

## 2015-12-28 MED ORDER — IBUPROFEN 600 MG PO TABS: 600.0000 mg | ORAL_TABLET | Freq: Four times a day (QID) | ORAL | 0 refills | Status: AC

## 2015-12-28 NOTE — Progress Notes (Addendum)
Patient is doing well.  She is ambulating, voiding, tolerating PO.  Pain control is good.  Lochia is appropriate BPs have been normal to mild range since delivery.  All <140/90 in last 24h Denies HA/BV/RUQ pain.   Vitals:   12/27/15 1625 12/27/15 2045 12/27/15 2330 12/28/15 0635  BP: (!) 141/82 130/71 140/83 126/84  Pulse: 75 70 79 77  Resp:    20  Temp: 97.9 F (36.6 C) 98.7 F (37.1 C) 98.4 F (36.9 C)   TempSrc: Oral Oral    SpO2:  98% 99%   Weight:      Height:        NAD Fundus firm Ext: 1+ edema bilateral  Lab Results  Component Value Date   WBC 14.8 (H) 12/26/2015   HGB 10.2 (L) 12/26/2015   HCT 30.7 (L) 12/26/2015   MCV 90.8 12/26/2015   PLT 190 12/26/2015    --/--/A POS, A POS (10/24 1525)/RImmune  A/P 25 y.o. G1P1001 PPD#2 s/p IOL at 38w for severe preE. Severe preE by symptoms (HA, visual changes).  BP has never been severe range or required antihypertensive therapy --s/p 12h postpartum magnesium.  Asymptomatic.  BPs mild range.  Ok for d/c today.  Will f/u in office next week for BP check.  Symptoms reviewed  [facility] Center[facility]ANNA GEFFEL [employer]CL[facility]

## 2015-12-28 NOTE — Progress Notes (Signed)
Discharge instructions reviewed with patient. Pt denies any questions.

## 2015-12-28 NOTE — Discharge Summary (Addendum)
Obstetric Discharge Summary Reason for Admission: induction of labor Prenatal Procedures: Preeclampsia Intrapartum Procedures: spontaneous vaginal delivery Postpartum Procedures: none Complications-Operative and Postpartum: none and PP magnesium Hemoglobin  Date Value Ref Range Status  12/26/2015 10.2 (L) 12.0 - 15.0 g/dL Final   HGB  Date Value Ref Range Status  06/21/2013 14.7 12.0 - 16.0 g/dL Final   HCT  Date Value Ref Range Status  12/26/2015 30.7 (L) 36.0 - 46.0 % Final  06/21/2013 43.6 35.0 - 47.0 % Final    Physical Exam:  General: alert, cooperative and appears stated age 33Lochia: appropriate Uterine Fundus: firm DVT Evaluation: No evidence of DVT seen on physical exam.  Discharge Diagnoses: Term Pregnancy-delivered  Discharge Information: Date: 12/28/2015 Activity: pelvic rest Diet: routine Medications: PNV and Ibuprofen Condition: stable Instructions: refer to practice specific booklet Discharge to: home Follow-up Information    Jane Gould, Jane STACIA, MD Follow up on 01/03/2016.   Specialty:  Obstetrics and Gynecology Why:  BP check in 4-5 days Contact information: [address] [place] [address] [phone]           Newborn Data: Live born female  Birth Weight: 5 lb 3.8 oz (2376 g) APGAR: 7, 8  Home with mother.  Jane Gould Jane Gould Jane Gould 12/28/2015, 8:45 AM

## 2015-12-28 NOTE — Discharge Instructions (Signed)
Pelvic rest x 6 weeks (no intercourse or tampons)   Do not drive until you are not taking narcotic pain medication AND you can comfortably slam on the brakes.  Please continue to take a prenatal vitamin daily.    Please call for severe headache unrelieved with medication, visual changes or right upper quadrant pain.

## 2015-12-28 NOTE — Lactation Note (Signed)
This note was copied from a baby's chart. Lactation Consultation Note  Patient Name: Jane Lawson RadarCalli Standish WUJWJ'XToday's Date: 12/28/2015 Reason for consult: Follow-up assessment   With this mom of a term SGA baby, now 5656 hours old. Mom is breast feeding and then supplementing with her EBM, and then formula also is intend. I was told the baby is wet burping a lot. Parents are still supplementing at time with curved tip syringe. I told mom this can cause baby to swallow a lot of air, and at this point, with baby under 5 pounds, , I would recommend bottle feeding in a side lying position, which I showed parents how to do. Mom has been increasing supplement to 25 ml's after breast feeding. I told her  She can even increase amount of supplement from this . Mom to call lactation for questions/concerns, and for an o/p consult, at her convenience.    Maternal Data    Feeding Feeding Type: Bottle Fed - Formula  LATCH Score/Interventions                      Lactation Tools Discussed/Used     Consult Status Consult Status: Complete Follow-up type: Call as needed    Jane Gould, Jane Gould 12/28/2015, 10:09 AM

## 2016-01-17 ENCOUNTER — Encounter (HOSPITAL_COMMUNITY)
Admission: RE | Admit: 2016-01-17 | Discharge: 2016-01-17 | Disposition: A | Discharge status: Home / Self Care | Source: Ambulatory Visit | Attending: Obstetrics & Gynecology | Admitting: Obstetrics & Gynecology

## 2016-01-17 NOTE — Lactation Note (Signed)
Lactation Consult  Mother's reason for visit:  Milk supply issues, latch check Visit Type:  Outpatient Appointment Notes:  Baby latches but comes off and on during feeding and seems to be chomping occasionally.  Noted some swallows.  After switching between breasts with approx 15 min of feeding baby took 30 ml.  Mother states that the bottom of the Left nipple burns off and on during feeding.  No white patches in infant's mouth and no shooting pain up into breast.  Applied #20NS and baby seemed to sustain latch better.  Baby fell asleep after another 15 min of feeding with nipple shield.  Breast milk in nipple shield after feeding.  Re-weighed baby and she took an additional 20 ml of breastmilk.  Suggest giving baby additional breastmilk after feeding with slow flow nipple.  Baby took 30 ml extra of breastmilk in bottle. Mother stated her milk supply with pumping was less than 1 oz until she started taking Lactation Plus by [employer] about 1 week ago. Continue post pumping at least 4-6 times a day and giving volume back to baby after breastfeeding.  In a few days try breastfeeding on left side without nipple shield and see if burning continues.  If burning continues or gets worse, contact us for another appt.  Discussed fenugreek.  Breastfeed before offering bottle and stop using pacifier until latch improves.   Consult:  Initial Lactation Consultant:  Hardie PulleyBerkelhammer, Handy Mcloud Boschen  ________________________________________________________________________ Joan FloresBaby's Name:  Georges LynchNatalie Lois Lerew Date of Birth:  12/26/2017 Pediatrician:  [facility]   Gender:  female Gestational Age: 5253w3d (At Birth) Birth Weight:  5 lb 3.8 oz (2376 g) Weight at Discharge:    Weight: (!) 5 lb 0.8 oz (2290 g)                         Date of Discharge:  12/28/2015      Filed Weights   12/26/15 2324 12/27/15 2332 12/28/15 1017  Weight: (!) 5 lb 0.8 oz (2291 g) (!) 4 lb 15.4 oz (2251 g) (!) 5 lb 0.8 oz (2290 g)  Last weight taken  from location [facility]:  5 lb 9 oz   Location:Pediatrician's office Weight today:  6 lb 5.5 oz.    ________________________________________________________________________  Mother's Name: Oneta Rackalli L Mcneil Type of delivery:   Breastfeeding Experience:  Primip Maternal Medications:  PNV, lactation plus  ________________________________________________________________________  Breastfeeding History (Post Discharge)  Frequency of breastfeeding:  3-4 times per day Duration of feeding:  10 min  Supplementation  Formula: Has only given baby formula one time Breastmilk:  Volume 60ml Frequency:  8 Total volume per day:  480 ml  Method:  Bottle,   Pumping  Type of pump:  Medela pump in style Frequency:  Every 2-3 hours Volume:  2-4.5 oz    Infant Intake and Output Assessment  Voids:  10-15 in 24 hrs.  Color:  Clear yellow Stools:  1-4 in 24 hrs.  Color:  Yellow  ________________________________________________________________________  Maternal Breast Assessment  Breast:  Soft Nipple:  Erect Pain level:  4 Pain interventions:Nipple shield  _______________________________________________________________________ Feeding Assessment/Evaluation  Initial feeding assessment:  Infant's oral assessment:  Variance  Positioning:  Cross cradle Left breast  LATCH documentation:  Latch:  2 = Grasps breast easily, tongue down, lips flanged, rhythmical sucking.  Audible swallowing:  1 = A few with stimulation  Type of nipple:  2 = Everted at rest and after stimulation  Comfort (Breast/Nipple):  1 = Filling, red/small blisters or bruises, mild/mod discomfort  Hold (Positioning):  1 = Assistance needed to correctly position infant at breast and maintain latch  LATCH score:  7  Attached assessment:  Deep  Lips flanged:  No.  Lips untucked:  Yes.    Suck assessment:  Displays both  Tools:  Nipple shield 20 mm Instructed on use and cleaning of tool:  Yes.     Pre-feed weight:  2878 g  (6 lb. 5.5 oz.) Post-feed weight:  2908 g (6 lb. 6.6 oz.) Amount transferred:  30 ml  Additional Feeding Assessment -   Infant's oral assessment:  Variance  Positioning:  Football Right breast  LATCH documentation:  Latch:  2 = Grasps breast easily, tongue down, lips flanged, rhythmical sucking.  Audible swallowing:  1 = A few with stimulation  Type of nipple:  2 = Everted at rest and after stimulation  Comfort (Breast/Nipple):  2 = Soft / non-tender  Hold (Positioning):  2 = No assistance needed to correctly position infant at breast  LATCH score:  7  Attached assessment:  Deep  Lips flanged:  No.  Lips untucked:  Yes.    Suck assessment:  Displays both  Tools:  Nipple shield 20 mm Instructed on use and cleaning of tool:  Yes.    Pre-feed weight:  2908 g  (6 lb. 6.6 oz.) Post-feed weight:  2928 g (6 lb. 7.3 oz.) Amount transferred:  20 ml Amount supplemented:  30 ml  Total amount transferred:  50 ml Total supplement given:  30  ml

## 2016-02-17 ENCOUNTER — Encounter (HOSPITAL_COMMUNITY): Payer: Self-pay | Admitting: Oncology

## 2016-02-17 ENCOUNTER — Emergency Department (HOSPITAL_COMMUNITY)
Admission: EM | Admit: 2016-02-17 | Discharge: 2016-02-17 | Disposition: A | Discharge status: Home / Self Care | Attending: Emergency Medicine | Admitting: Emergency Medicine

## 2016-02-17 DIAGNOSIS — R21 Rash and other nonspecific skin eruption: Secondary | ICD-10-CM | POA: Diagnosis present

## 2016-02-17 DIAGNOSIS — F17210 Nicotine dependence, cigarettes, uncomplicated: Secondary | ICD-10-CM | POA: Insufficient documentation

## 2016-02-17 MED ORDER — TRIAMCINOLONE ACETONIDE 0.1 % EX CREA: 1.0000 "application " | TOPICAL_CREAM | Freq: Two times a day (BID) | CUTANEOUS | 0 refills | Status: AC

## 2016-02-17 MED ORDER — HYDROXYZINE HCL 25 MG PO TABS: 25.0000 mg | ORAL_TABLET | Freq: Four times a day (QID) | ORAL | 0 refills | Status: AC

## 2016-02-17 NOTE — ED Triage Notes (Signed)
Pt reports a rash that started about a week and a half ago.  Pt states that rash has progressively spread over most of her body.  Pt has tried OTC medications w/o relief.  No new foods, detergents, or medications.

## 2016-02-17 NOTE — ED Provider Notes (Signed)
[facility]-EMERGENCY DEPT Provider Note   CSN: [id number] Arrival date & time: 02/17/16  0530     History   Chief Complaint Chief Complaint  Patient presents with  . Rash    HPI Jane Gould is a 25 y.o. female.  +   The history is provided by the patient.  Rash   This is a new problem. The current episode started more than 1 week ago. The problem has been gradually worsening. The problem is associated with an unknown (Patient just had a baby at the beginning of November) factor. There has been no fever. The rash is present on the torso, back, trunk, left arm, right arm, right upper leg, right lower leg, left upper leg, left lower leg, neck and abdomen. The pain is at a severity of 2/10. The patient is experiencing no pain (Severe itching). The pain has been constant since onset. Associated symptoms include itching. Pertinent negatives include no blisters and no weeping. She has tried OTC analgesics for the symptoms. The treatment provided mild relief. Risk factors include new environmental exposures (Pregnancy).    Past Medical History:  Diagnosis Date  . Anxiety     Patient Active Problem List   Diagnosis Date Noted  . Normal vaginal delivery 12/26/2015  . Gestational hypertension 12/24/2015    Past Surgical History:  Procedure Laterality Date  . NO PAST SURGERIES      OB History    Gravida Para Term Preterm AB Living   1 1 1  0 0 1   SAB TAB Ectopic Multiple Live Births   0 0 0 0 1       Home Medications    Prior to Admission medications   Medication Sig Start Date End Date Taking? Authorizing Provider  hydrOXYzine (ATARAX/VISTARIL) 25 MG tablet Take 1 tablet (25 mg total) by mouth every 6 (six) hours. 02/17/16   Rise MuKenneth T Robena Ewy, PA-C  ibuprofen (ADVIL,MOTRIN) 600 MG tablet Take 1 tablet (600 mg total) by mouth every 6 (six) hours. 12/28/15   Marlow Baarsyanna Clark, MD  Prenat-FeAsp-Meth-FA-DHA w/o A (PRENATE PIXIE) 10-0.6-0.4-200 MG CAPS Take 1 capsule by mouth daily.     Historical Provider, MD  triamcinolone cream (KENALOG) 0.1 % Apply 1 application topically 2 (two) times daily. 02/17/16   Rise MuKenneth T Stetson Pelaez, PA-C    Family History Family History  Problem Relation Age of Onset  . Heart disease Paternal Grandfather     Social History Social History  Substance Use Topics  . Smoking status: Current Every Day Smoker    Packs/day: 0.25    Types: Cigarettes  . Smokeless tobacco: Never Used  . Alcohol use Yes     Allergies   Patient has no known allergies.   Review of Systems Review of Systems  Constitutional: Negative for chills and fever.  Skin: Positive for itching and rash.  Neurological: Negative for dizziness, weakness, light-headedness and headaches.  All other systems reviewed and are negative.    Physical Exam Updated Vital Signs BP 127/88 (BP Location: Left Arm)   Pulse 75   Temp 97.6 F (36.4 C) (Oral)   Resp 16   Ht 5\' 5"  (1.651 m)   Wt 83.9 kg   LMP 02/16/2016 (Exact Date)   SpO2 98%   BMI 30.79 kg/m   Physical Exam  Constitutional: She appears well-developed and well-nourished. No distress.  HENT:  Head: Normocephalic and atraumatic.  Mouth/Throat: Oropharynx is clear and moist.  Eyes: Conjunctivae are normal. Right eye exhibits no discharge. Left  eye exhibits no discharge. No scleral icterus.  Neck: Normal range of motion. Neck supple.  Pulmonary/Chest: No respiratory distress.  Musculoskeletal: Normal range of motion.  Lymphadenopathy:    She has no cervical adenopathy.  Neurological: She is alert.  Skin: Skin is warm and dry. Capillary refill takes less than 2 seconds. Rash noted. No pallor.  puritic, erythematous papules noted within the striae of the abdomen also noted on the chest, torso, bilateral extremities. Some of coalesced to form urticarial plaques. It spares the face, palms, and soles.  Nursing note and vitals reviewed.    ED Treatments / Results  Labs (all labs ordered are listed, but only  abnormal results are displayed) Labs Reviewed - No data to display  EKG  EKG Interpretation None       Radiology No results found.  Procedures Procedures (including critical care time)  Medications Ordered in ED Medications - No data to display   Initial Impression / Assessment and Plan / ED Course  I have reviewed the triage vital signs and the nursing notes.  Pertinent labs & imaging results that were available during my care of the patient were reviewed by me and considered in my medical decision making (see chart for details).  Clinical Course   Patient presents to the ED with generalized pruritic rash in the setting of no new foods, detergents, medications.. The patient recently gave birth approximately one month ago. Rash is consistent with pruritic urticarial papules and plaques of pregnancy. No signs of infection. Patient is afebrile and not tachycardic. We'll treat symptomatically with topical steroids and Benadryl. Patient encouraged to follow up with her primary care doctor for symptoms not resolve in next 3-4 days. For further management. She was given strict return precautions.      Final Clinical Impressions(s) / ED Diagnoses   Final diagnoses:  Rash    New Prescriptions New Prescriptions   HYDROXYZINE (ATARAX/VISTARIL) 25 MG TABLET    Take 1 tablet (25 mg total) by mouth every 6 (six) hours.   TRIAMCINOLONE CREAM (KENALOG) 0.1 %    Apply 1 application topically 2 (two) times daily.     Rise MuKenneth T Richmond Coldren, PA-C 02/17/16 [id number]    Shon Batonourtney F Horton, MD 02/17/16 [phone]

## 2016-02-17 NOTE — ED Notes (Signed)
Patient is alert and oriented x3.  She was given DC instructions and follow up visit instructions.  Patient gave verbal understanding. She was DC ambulatory under her own power to home.  V/S stable.  He was not showing any signs of distress on DC 

## 2016-02-17 NOTE — Discharge Instructions (Signed)
I have given you a steroid cream to use on your body 1-2 times per day. You may also take the hydroxyzine for itching. It will make you sleeping so do not drive with it. You can also buy over the counter zantac or zyrtec/Claritin to help with itching and inflammation. Take as the bottle directs you. Please do not breast feed with these meds until you clear it with your ob/gyn. See your primary care doctor on Thursday if symptoms do not improve. May need to follow up with Dermatologist. Return to the ED if your symptoms worsen.

## 2016-06-09 ENCOUNTER — Emergency Department (HOSPITAL_COMMUNITY)

## 2016-06-09 ENCOUNTER — Observation Stay (HOSPITAL_COMMUNITY)
Admission: EM | Admit: 2016-06-09 | Discharge: 2016-06-09 | Disposition: A | Discharge status: Home / Self Care | Attending: Internal Medicine | Admitting: Internal Medicine

## 2016-06-09 ENCOUNTER — Encounter (HOSPITAL_COMMUNITY): Payer: Self-pay | Admitting: Emergency Medicine

## 2016-06-09 DIAGNOSIS — O218 Other vomiting complicating pregnancy: Secondary | ICD-10-CM | POA: Insufficient documentation

## 2016-06-09 DIAGNOSIS — R112 Nausea with vomiting, unspecified: Secondary | ICD-10-CM | POA: Diagnosis present

## 2016-06-09 DIAGNOSIS — Z3A01 Less than 8 weeks gestation of pregnancy: Secondary | ICD-10-CM | POA: Diagnosis not present

## 2016-06-09 DIAGNOSIS — O99331 Smoking (tobacco) complicating pregnancy, first trimester: Secondary | ICD-10-CM | POA: Diagnosis not present

## 2016-06-09 DIAGNOSIS — Z79899 Other long term (current) drug therapy: Secondary | ICD-10-CM | POA: Insufficient documentation

## 2016-06-09 DIAGNOSIS — R1013 Epigastric pain: Secondary | ICD-10-CM | POA: Insufficient documentation

## 2016-06-09 DIAGNOSIS — R109 Unspecified abdominal pain: Secondary | ICD-10-CM

## 2016-06-09 DIAGNOSIS — F17210 Nicotine dependence, cigarettes, uncomplicated: Secondary | ICD-10-CM | POA: Insufficient documentation

## 2016-06-09 DIAGNOSIS — O26891 Other specified pregnancy related conditions, first trimester: Secondary | ICD-10-CM | POA: Diagnosis not present

## 2016-06-09 DIAGNOSIS — O26899 Other specified pregnancy related conditions, unspecified trimester: Secondary | ICD-10-CM

## 2016-06-09 LAB — ABO/RH: ABO/RH(D): A POS

## 2016-06-09 LAB — COMPREHENSIVE METABOLIC PANEL
ALT: 17 U/L (ref 14–54)
AST: 22 U/L (ref 15–41)
Albumin: 4.4 g/dL (ref 3.5–5.0)
Alkaline Phosphatase: 64 U/L (ref 38–126)
Anion gap: 8 (ref 5–15)
BUN: 8 mg/dL (ref 6–20)
CHLORIDE: 103 mmol/L (ref 101–111)
CO2: 24 mmol/L (ref 22–32)
CREATININE: 0.67 mg/dL (ref 0.44–1.00)
Calcium: 9.2 mg/dL (ref 8.9–10.3)
GFR calc Af Amer: >60 mL/min (ref >60)
Glucose, Bld: 130 mg/dL — ABNORMAL HIGH (ref 65–99)
POTASSIUM: 4 mmol/L (ref 3.5–5.1)
SODIUM: 135 mmol/L (ref 135–145)
Total Bilirubin: 0.8 mg/dL (ref 0.3–1.2)
Total Protein: 7.5 g/dL (ref 6.5–8.1)

## 2016-06-09 LAB — LIPASE, BLOOD: LIPASE: 20 U/L (ref 11–51)

## 2016-06-09 LAB — CBC
HEMATOCRIT: 44.2 % (ref 36.0–46.0)
HEMOGLOBIN: 15 g/dL (ref 12.0–15.0)
MCH: 29.4 pg (ref 26.0–34.0)
MCHC: 33.9 g/dL (ref 30.0–36.0)
MCV: 86.7 fL (ref 78.0–100.0)
Platelets: 360 10*3/uL (ref 150–400)
RBC: 5.1 MIL/uL (ref 3.87–5.11)
RDW: 13.6 % (ref 11.5–15.5)
WBC: 12.2 10*3/uL — ABNORMAL HIGH (ref 4.0–10.5)

## 2016-06-09 LAB — URINALYSIS, ROUTINE W REFLEX MICROSCOPIC
BILIRUBIN URINE: NEGATIVE
Glucose, UA: NEGATIVE mg/dL
HGB URINE DIPSTICK: NEGATIVE
Ketones, ur: 80 mg/dL — AB
Leukocytes, UA: NEGATIVE
Nitrite: NEGATIVE
PH: 8 (ref 5.0–8.0)
Protein, ur: NEGATIVE mg/dL
SPECIFIC GRAVITY, URINE: 1.024 (ref 1.005–1.030)

## 2016-06-09 LAB — I-STAT BETA HCG BLOOD, ED (MC, WL, AP ONLY): I-stat hCG, quantitative: >2000 m[IU]/mL — ABNORMAL HIGH (ref <5)

## 2016-06-09 LAB — HCG, QUANTITATIVE, PREGNANCY: hCG, Beta Chain, Quant, S: 33975 m[IU]/mL — ABNORMAL HIGH (ref <5)

## 2016-06-09 MED ORDER — SODIUM CHLORIDE 0.9 % IV SOLN
Freq: Once | INTRAVENOUS | Status: AC
  Administered 2016-06-09: 18:00:00 via INTRAVENOUS

## 2016-06-09 MED ORDER — PANTOPRAZOLE SODIUM 40 MG IV SOLR
40.0000 mg | Freq: Two times a day (BID) | INTRAVENOUS | Status: DC
  Filled 2016-06-09: qty 40

## 2016-06-09 MED ORDER — HYDROXYZINE HCL 25 MG PO TABS
25.0000 mg | ORAL_TABLET | Freq: Once | ORAL | Status: AC
  Administered 2016-06-09: 25 mg via ORAL
  Filled 2016-06-09: qty 1

## 2016-06-09 MED ORDER — ACETAMINOPHEN 325 MG PO TABS
650.0000 mg | ORAL_TABLET | Freq: Once | ORAL | Status: DC
  Filled 2016-06-09: qty 2

## 2016-06-09 MED ORDER — PROMETHAZINE HCL 25 MG/ML IJ SOLN
12.5000 mg | Freq: Once | INTRAMUSCULAR | Status: AC
  Administered 2016-06-09: 12.5 mg via INTRAVENOUS
  Filled 2016-06-09: qty 1

## 2016-06-09 MED ORDER — SODIUM CHLORIDE 0.9 % IV BOLUS (SEPSIS)
1000.0000 mL | Freq: Once | INTRAVENOUS | Status: AC
  Administered 2016-06-09: 1000 mL via INTRAVENOUS

## 2016-06-09 MED ORDER — ONDANSETRON HCL 4 MG/2ML IJ SOLN
4.0000 mg | Freq: Once | INTRAMUSCULAR | Status: AC
  Administered 2016-06-09: 4 mg via INTRAVENOUS
  Filled 2016-06-09: qty 2

## 2016-06-09 MED ORDER — DOXYLAMINE SUCCINATE (SLEEP) 25 MG PO TABS
25.0000 mg | ORAL_TABLET | Freq: Once | ORAL | Status: DC
  Filled 2016-06-09: qty 1

## 2016-06-09 MED ORDER — PYRIDOXINE HCL 25 MG PO TABS
25.0000 mg | ORAL_TABLET | Freq: Once | ORAL | Status: AC
  Administered 2016-06-09: 25 mg via ORAL
  Filled 2016-06-09: qty 1

## 2016-06-09 MED ORDER — METOCLOPRAMIDE HCL 5 MG/ML IJ SOLN
10.0000 mg | Freq: Once | INTRAMUSCULAR | Status: AC
  Administered 2016-06-09: 10 mg via INTRAVENOUS
  Filled 2016-06-09: qty 2

## 2016-06-09 NOTE — ED Notes (Signed)
Pt reported she would stay for IV fluids. Fluids initiated and vitamins given-pt now states she is leaving. Pt advised she would need to sign AMA or wait for discharge paperwork. Pt states she will wait for paperwork. During time this writer attempting to contact provider for paperwork. Pt removed IV, and left facility. Unable to obtain discharge or AMA vitals/signature. Pt ambulatory and alert at discharge. Pt left w/ brother.

## 2016-06-09 NOTE — ED Notes (Signed)
US at bedside

## 2016-06-09 NOTE — ED Notes (Signed)
Pt aware UA needed states she will try once vistaril begins to work.

## 2016-06-09 NOTE — ED Notes (Signed)
Pt emesis occurrence x2, pt still c/o nausea at this time. Marijean Niemann, PA-C notified.

## 2016-06-09 NOTE — ED Triage Notes (Signed)
Per [facility] patient comes in for epigastric pain from home. patient was vomiting and diaphoretic.  Patient was given Zofran  and Fentanyl via 18g in left forearm. Patient denies any diarrhea. Patient recently started weight loss program.

## 2016-06-09 NOTE — ED Provider Notes (Signed)
[facility]-EMERGENCY DEPT Provider Note   CSN: [id number] Arrival date & time: 06/09/16  1327     History   Chief Complaint Chief Complaint  Patient presents with  . Abdominal Pain  . Emesis    HPI Jane Gould is a 26 y.o. female.  The history is provided by the patient and medical records. No language interpreter was used.  Abdominal Pain   Associated symptoms include nausea and vomiting. Pertinent negatives include fever, diarrhea, constipation, dysuria, frequency and headaches.  Emesis   Associated symptoms include abdominal pain. Pertinent negatives include no chills, no cough, no diarrhea, no fever and no headaches.   Jane Gould is a 26 y.o. female  with a PMH of anxiety who presents to the Emergency Department complaining of acute onset of epigastric abdominal pain described as a sharp burning sensation which began this morning. Patient states pain comes-and-does. Will last about 10 minutes then resolve for a few minutes, but return.  Associated symptoms include nausea and multiple episodes of emesis. She reports that now she is "dry-heaving" because she has kept not food or fluids down. She was given  of zofran and 50 of Fentanyl by EMS prior to arrival with minimal improvement. No fevers, chills, dysuria, constipation, diarrhea, blood in the stool. Patient had SVD on 12/26/15 with no reported complications. She has been breast feeding since with inconsistencies in menses. She did have what appeared to her as a normal menstrual cycle 1.5 months ago. She has not experienced any vaginal bleeding or discharge since that time.   Past Medical History:  Diagnosis Date  . Anxiety     Patient Active Problem List   Diagnosis Date Noted  . Intractable nausea and vomiting 06/09/2016  . Normal vaginal delivery 12/26/2015  . Gestational hypertension 12/24/2015    Past Surgical History:  Procedure Laterality Date  . NO PAST SURGERIES      OB History    Gravida Para Term Preterm AB  Living   0 0 1   SAB TAB Ectopic Multiple Live Births   0 0 0 0 1       Home Medications    Prior to Admission medications   Medication Sig Start Date End Date Taking? Authorizing Provider  metoCLOPramide (REGLAN) 10 MG tablet Take 10 mg by mouth every 6 (six) hours as needed for nausea or vomiting.   Yes Historical Provider, MD  hydrOXYzine (ATARAX/VISTARIL) 25 MG tablet Take 1 tablet (25 mg total) by mouth every 6 (six) hours. Patient not taking: Reported on 06/09/2016 02/17/16   Rise Mu, PA-C  ibuprofen (ADVIL,MOTRIN) 600 MG tablet Take 1 tablet (600 mg total) by mouth every 6 (six) hours. Patient not taking: Reported on 06/09/2016 12/28/15   Marlow Baars, MD  triamcinolone cream (KENALOG) 0.1 % Apply 1 application topically 2 (two) times daily. Patient not taking: Reported on 06/09/2016 02/17/16   Rise Mu, PA-C    Family History Family History  Problem Relation Age of Onset  . Heart disease Paternal Grandfather     Social History Social History  Substance Use Topics  . Smoking status: Current Every Day Smoker    Packs/day: 0.25    Types: Cigarettes  . Smokeless tobacco: Never Used  . Alcohol use Yes     Allergies   Patient has no known allergies.   Review of Systems Review of Systems  Constitutional: Negative for chills and fever.  HENT: Negative for congestion.   Eyes: Negative  for visual disturbance.  Respiratory: Negative for cough.   Cardiovascular: Negative for chest pain.  Gastrointestinal: Positive for abdominal pain, nausea and vomiting. Negative for blood in stool, constipation and diarrhea.  Genitourinary: Negative for dysuria, frequency, vaginal bleeding and vaginal discharge.  Musculoskeletal: Negative for back pain.  Skin: Negative for wound.  Neurological: Negative for headaches.     Physical Exam Updated Vital Signs BP (!) 135/92 (BP Location: Left Arm)   Pulse 73   Temp 97.6 F (36.4 C) (Oral)   Resp 19   Ht   (1.651 m)   Wt 86.2 kg   LMP 05/10/2016   SpO2 98%   BMI 31.62 kg/m   Physical Exam  Constitutional: She is oriented to person, place, and time. She appears well-developed and well-nourished. No distress.  HENT:  Head: Normocephalic and atraumatic.  Cardiovascular: Normal rate, regular rhythm and normal heart sounds.   No murmur heard. Pulmonary/Chest: Effort normal and breath sounds normal. No respiratory distress. She has no wheezes. She has no rales.  Abdominal: Soft. Bowel sounds are normal. She exhibits no distension. There is tenderness (Epigastrium and RUQ).  No CVA tenderness.   Musculoskeletal: Normal range of motion.  Neurological: She is alert and oriented to person, place, and time.  Skin: Skin is warm and dry.  Nursing note and vitals reviewed.    ED Treatments / Results  Labs (all labs ordered are listed, but only abnormal results are displayed) Labs Reviewed  COMPREHENSIVE METABOLIC PANEL - Abnormal; Notable for the following:       Result Value   Glucose, Bld 130 (*)    All other components within normal limits  CBC - Abnormal; Notable for the following:    WBC 12.2 (*)    All other components within normal limits  URINALYSIS, ROUTINE W REFLEX MICROSCOPIC - Abnormal; Notable for the following:    Ketones, ur 80 (*)    All other components within normal limits  HCG, QUANTITATIVE, PREGNANCY - Abnormal; Notable for the following:    hCG, Beta Chain, Quant, S 33,975 (*)    All other components within normal limits  I-STAT BETA HCG BLOOD, ED ([facility], [facility], [facility] ONLY) - Abnormal; Notable for the following:    I-stat hCG, quantitative >2,000.0 (*)    All other components within normal limits  LIPASE, BLOOD  ABO/RH    EKG  EKG Interpretation None       Radiology US Ob Comp < 14 Wks  Result Date: 06/09/2016 CLINICAL DATA:  Abdominal pain x 1 day EXAM: OBSTETRIC <14 WK [place] AND TRANSVAGINAL OB US TECHNIQUE: Both transabdominal and transvaginal ultrasound  examinations were performed for complete evaluation of the gestation as well as the maternal uterus, adnexal regions, and pelvic cul-de-sac. Transvaginal technique was performed to assess early pregnancy. COMPARISON:  None. FINDINGS: Intrauterine gestational sac: Single Yolk sac:  Visualized. Embryo:  Visualized. Cardiac Activity: Visualized. Heart Rate: 111  bpm CRL:  7.9  mm   6 w   5 d                  [place] EDC: 01/29/2017 Subchorionic hemorrhage:  None visualized. Maternal uterus/adnexae: Normal IMPRESSION: Single live intrauterine gestation at 6 weeks 5 days, [facility] 01/29/2017. No sonographic findings for the abdominal pain. Electronically Signed   By: Tollie Eth M.D.   On: 06/09/2016 16:54   US Ob Transvaginal  Result Date: 06/09/2016 CLINICAL DATA:  Abdominal pain x 1 day EXAM: OBSTETRIC <14 WK [place]  AND TRANSVAGINAL OB US TECHNIQUE: Both transabdominal and transvaginal ultrasound examinations were performed for complete evaluation of the gestation as well as the maternal uterus, adnexal regions, and pelvic cul-de-sac. Transvaginal technique was performed to assess early pregnancy. COMPARISON:  None. FINDINGS: Intrauterine gestational sac: Single Yolk sac:  Visualized. Embryo:  Visualized. Cardiac Activity: Visualized. Heart Rate: 111  bpm CRL:  7.9  mm   6 w   5 d                  [place] EDC: 01/29/2017 Subchorionic hemorrhage:  None visualized. Maternal uterus/adnexae: Normal IMPRESSION: Single live intrauterine gestation at 6 weeks 5 days, [facility] 01/29/2017. No sonographic findings for the abdominal pain. Electronically Signed   By: Tollie Eth M.D.   On: 06/09/2016 16:54   US Abdomen Limited Ruq  Result Date: 06/09/2016 CLINICAL DATA:  Abdominal pain x 1 day. EXAM: US ABDOMEN LIMITED - RIGHT UPPER QUADRANT COMPARISON:  None. FINDINGS: Gallbladder: No gallstones or wall thickening visualized. No sonographic Murphy sign noted by sonographer. Common bile duct: Diameter:  2.6 mm Liver: No focal lesion identified.  Within normal limits in parenchymal echogenicity. IMPRESSION: No acute abnormality. No sonographic findings for the patient's pain. Electronically Signed   By: Tollie Eth M.D.   On: 06/09/2016 16:56    Procedures Procedures (including critical care time)  Medications Ordered in ED Medications  acetaminophen (TYLENOL) tablet 650 mg (650 mg Oral Not Given 06/09/16 1614)  pantoprazole (PROTONIX) injection 40 mg (40 mg Intravenous Not Given 06/09/16 2015)  doxylamine (Sleep) (UNISOM) tablet 25 mg (25 mg Oral Refused 06/09/16 2115)  sodium chloride 0.9 % bolus 1,000 mL (0 mLs Intravenous Stopped 06/09/16 1756)  ondansetron (ZOFRAN) injection 4 mg (4 mg Intravenous Given 06/09/16 1604)  promethazine (PHENERGAN) injection 12.5 mg (12.5 mg Intravenous Given 06/09/16 1652)  0.9 %  sodium chloride infusion ( Intravenous Stopped 06/09/16 2127)  hydrOXYzine (ATARAX/VISTARIL) tablet 25 mg (25 mg Oral Given 06/09/16 1806)  metoCLOPramide (REGLAN) injection 10 mg (10 mg Intravenous Given 06/09/16 1855)  sodium chloride 0.9 % bolus 1,000 mL (0 mLs Intravenous Stopped 06/09/16 2127)  pyridOXINE (VITAMIN B-6) tablet 25 mg (25 mg Oral Given 06/09/16 2114)     Initial Impression / Assessment and Plan / ED Course  I have reviewed the triage vital signs and the nursing notes.  Pertinent labs & imaging results that were available during my care of the patient were reviewed by me and considered in my medical decision making (see chart for details).    Shykeria L Nyce is a 26 y.o. female who presents to ED for epigastric pain, nausea and vomiting. Patient had child via SVD in October 2017. Pregnancy test today positive. Discussed results with patient. On exam, patient with tenderness to the epigastrium. No lower abdominal or CVA tenderness. No vaginal bleeding/discharge or urinary complaints. Had a normal BM today.  Will give nausea meds, fluids and obtain labs, RUQ and ob ultrasounds for further evaluation.   Labs reviewed  and reassuring.  RUQ ultrasound negative.  OB ultrasound shows single live IUP at 6 weeks, 5 days.   Patient has had zofran by EMS prior to arrival along with four doses of anti-emetic while in ED today. Unfortunately she has continued to fail PO challenge despite intervention. Hospitalist consulted who will admit.   Addendum: Spoke with patient about plan for admission and she was agreeable. Hospitalist, Dr. Katrinka Blazing, came down to evaluate patient and informed him that she did not want  to stay and would like to go home. I went back into the room with Dr. Katrinka Blazing and strongly encouraged her to stay. She states that she has a 54 month old at home and no family or friends to watch child tonight. I have discussed my concerns as a provider and the possibility that this may worsen. We discussed the nature, risks and benefits, and alternatives to admission. I have specifically discussed that without further monitoring,a life threatening event could occur. I specifically informed her of my concerns that she become dehydrated and have syncopal episode while alone at home leading to life-threatening event for both her and unborn child.  Time was given to allow the opportunity to ask questions and consider the options, and after the discussion, the patient decided to refused admission. Pt is A&Ox4, her own POA and states understanding of my concerns and the possible consequences. After refusal, I made every reasonable opportunity to treat them to the best of my ability.have made the patient aware that this is an AMA discharge, but he may return at any time for further evaluation and treatment. I offered rx for nausea medication and while working on discharge paperwork, nursing staff informed me that patient had taken out her own IV and left.   Final Clinical Impressions(s) / ED Diagnoses   Final diagnoses:  Abdominal pain in pregnancy  Abdominal pain in pregnancy  Abdominal pain    New Prescriptions Discharge  Medication List as of 06/09/2016  9:45 PM         [facility] Monserratt Knezevic, PA-C 06/09/16 2229    Mancel Bale, MD 06/10/16 [phone]

## 2017-02-03 ENCOUNTER — Ambulatory Visit: Admitting: Family Medicine

## 2018-03-25 ENCOUNTER — Other Ambulatory Visit: Payer: Self-pay | Admitting: Physician Assistant

## 2018-03-25 ENCOUNTER — Ambulatory Visit
Admission: RE | Admit: 2018-03-25 | Discharge: 2018-03-25 | Disposition: A | Discharge status: Home / Self Care | Source: Ambulatory Visit | Attending: Physician Assistant | Admitting: Physician Assistant

## 2018-03-25 DIAGNOSIS — R1032 Left lower quadrant pain: Secondary | ICD-10-CM | POA: Diagnosis not present

## 2018-03-25 MED ORDER — IOHEXOL 300 MG/ML  SOLN
100.0000 mL | Freq: Once | INTRAMUSCULAR | Status: AC | PRN
  Administered 2018-03-25: 100 mL via INTRAVENOUS

## 2018-05-28 ENCOUNTER — Encounter (HOSPITAL_COMMUNITY): Payer: Self-pay

## 2018-05-28 ENCOUNTER — Emergency Department (HOSPITAL_COMMUNITY)
Admission: EM | Admit: 2018-05-28 | Discharge: 2018-05-28 | Disposition: A | Discharge status: Home / Self Care | Attending: Emergency Medicine | Admitting: Emergency Medicine

## 2018-05-28 ENCOUNTER — Other Ambulatory Visit: Payer: Self-pay

## 2018-05-28 DIAGNOSIS — Z79899 Other long term (current) drug therapy: Secondary | ICD-10-CM | POA: Insufficient documentation

## 2018-05-28 DIAGNOSIS — F1210 Cannabis abuse, uncomplicated: Secondary | ICD-10-CM | POA: Diagnosis not present

## 2018-05-28 DIAGNOSIS — F1290 Cannabis use, unspecified, uncomplicated: Secondary | ICD-10-CM

## 2018-05-28 DIAGNOSIS — R1110 Vomiting, unspecified: Secondary | ICD-10-CM | POA: Diagnosis not present

## 2018-05-28 DIAGNOSIS — R1013 Epigastric pain: Secondary | ICD-10-CM | POA: Diagnosis present

## 2018-05-28 HISTORY — DX: Cannabis abuse, uncomplicated: F12.10

## 2018-05-28 LAB — CBC WITH DIFFERENTIAL/PLATELET
Abs Immature Granulocytes: 0.01 10*3/uL (ref 0.00–0.07)
BASOS PCT: 1 %
Basophils Absolute: 0.1 10*3/uL (ref 0.0–0.1)
EOS PCT: 2 %
Eosinophils Absolute: 0.2 10*3/uL (ref 0.0–0.5)
HCT: 45.3 % (ref 36.0–46.0)
Hemoglobin: 14.4 g/dL (ref 12.0–15.0)
Immature Granulocytes: 0 %
Lymphocytes Relative: 14 %
Lymphs Abs: 1.2 10*3/uL (ref 0.7–4.0)
MCH: 30.1 pg (ref 26.0–34.0)
MCHC: 31.8 g/dL (ref 30.0–36.0)
MCV: 94.6 fL (ref 80.0–100.0)
MONO ABS: 0.5 10*3/uL (ref 0.1–1.0)
Monocytes Relative: 6 %
NEUTROS ABS: 6.3 10*3/uL (ref 1.7–7.7)
Neutrophils Relative %: 77 %
PLATELETS: 291 10*3/uL (ref 150–400)
RBC: 4.79 MIL/uL (ref 3.87–5.11)
RDW: 13.6 % (ref 11.5–15.5)
WBC: 8.2 10*3/uL (ref 4.0–10.5)
nRBC: 0 % (ref 0.0–0.2)

## 2018-05-28 LAB — COMPREHENSIVE METABOLIC PANEL
ALT: 48 U/L — ABNORMAL HIGH (ref 0–44)
ANION GAP: 10 (ref 5–15)
AST: 39 U/L (ref 15–41)
Albumin: 3.9 g/dL (ref 3.5–5.0)
Alkaline Phosphatase: 66 U/L (ref 38–126)
BILIRUBIN TOTAL: 0.9 mg/dL (ref 0.3–1.2)
BUN: 18 mg/dL (ref 6–20)
CHLORIDE: 106 mmol/L (ref 98–111)
CO2: 23 mmol/L (ref 22–32)
CREATININE: 0.7 mg/dL (ref 0.44–1.00)
Calcium: 8.6 mg/dL — ABNORMAL LOW (ref 8.9–10.3)
Glucose, Bld: 112 mg/dL — ABNORMAL HIGH (ref 70–99)
Potassium: 3.5 mmol/L (ref 3.5–5.1)
Sodium: 139 mmol/L (ref 135–145)
Total Protein: 6.9 g/dL (ref 6.5–8.1)

## 2018-05-28 LAB — LIPASE, BLOOD: LIPASE: 25 U/L (ref 11–51)

## 2018-05-28 LAB — I-STAT BETA HCG BLOOD, ED (MC, WL, AP ONLY)

## 2018-05-28 MED ORDER — METOCLOPRAMIDE HCL 10 MG PO TABS: 10.0000 mg | ORAL_TABLET | Freq: Four times a day (QID) | ORAL | 0 refills | Status: AC | PRN

## 2018-05-28 MED ORDER — METOCLOPRAMIDE HCL 5 MG/ML IJ SOLN
10.0000 mg | Freq: Once | INTRAMUSCULAR | Status: AC
  Administered 2018-05-28: 10 mg via INTRAVENOUS
  Filled 2018-05-28: qty 2

## 2018-05-28 MED ORDER — DIPHENHYDRAMINE HCL 25 MG PO TABS: 25.0000 mg | ORAL_TABLET | Freq: Four times a day (QID) | ORAL | 0 refills | Status: DC

## 2018-05-28 MED ORDER — CAPSAICIN 0.025 % EX CREA
TOPICAL_CREAM | Freq: Two times a day (BID) | CUTANEOUS | Status: DC
  Administered 2018-05-28: 13:00:00 via TOPICAL
  Filled 2018-05-28: qty 60

## 2018-05-28 MED ORDER — DIPHENHYDRAMINE HCL 50 MG/ML IJ SOLN
25.0000 mg | Freq: Once | INTRAMUSCULAR | Status: AC
  Administered 2018-05-28: 25 mg via INTRAVENOUS
  Filled 2018-05-28: qty 1

## 2018-05-28 MED ORDER — FAMOTIDINE IN NACL 20-0.9 MG/50ML-% IV SOLN
20.0000 mg | Freq: Once | INTRAVENOUS | Status: AC
  Administered 2018-05-28: 20 mg via INTRAVENOUS
  Filled 2018-05-28: qty 50

## 2018-05-28 MED ORDER — SODIUM CHLORIDE 0.9 % IV BOLUS
1000.0000 mL | Freq: Once | INTRAVENOUS | Status: AC
  Administered 2018-05-28: 1000 mL via INTRAVENOUS

## 2018-05-28 MED ORDER — CAPSAICIN 0.025 % EX CREA: TOPICAL_CREAM | Freq: Two times a day (BID) | CUTANEOUS | 0 refills | Status: AC

## 2018-05-28 MED ORDER — FAMOTIDINE 20 MG PO TABS: 20.0000 mg | ORAL_TABLET | Freq: Two times a day (BID) | ORAL | 0 refills | Status: AC

## 2018-05-28 NOTE — ED Notes (Signed)
ONE UNSUCCESSFUL LAB COLLECTION ATTEMPT 

## 2018-05-28 NOTE — ED Triage Notes (Signed)
Patient arrived via [facility]. Patient is AOx4 and ambulatory. Patient chief complaint is Nausea and abdominal pain. Patient did take Plan-B 2 days ago. Patient states she has been vomiting since this morning. Patient admits to using Marijuana and that's all.

## 2018-05-28 NOTE — ED Notes (Signed)
Patient has been discharged.

## 2018-05-28 NOTE — ED Provider Notes (Signed)
[facility]-EMERGENCY DEPT Provider Note   CSN: [id number] Arrival date & time: 05/28/18  1226    History   Chief Complaint Chief Complaint  Patient presents with  . Nausea  . Abdominal Pain    HPI Jane Gould is a 28 y.o. female.     HPI Patient reports that she started having recurrent vomiting, is started this morning.  She reports that she is got epigastric discomfort.  She reports intense nausea with vomiting over and over again.  No diarrhea.  No fever.  No shortness of breath.  No chest pain.  Reports she took a Plan B pill 2 days ago.  She denies she is having any vaginal discharge or bleeding.  She denies lower abdominal pain.  She reports she had diverticulitis once before.  She reports she had a lot of pain with that.  She does endorse marijuana use.  She denies any specific history of cyclic vomiting.  She denies any use of opioids.  No IV drug abuse. Past Medical History:  Diagnosis Date  . Anxiety   . Marijuana abuse, continuous 05/28/2018    Patient Active Problem List   Diagnosis Date Noted  . Intractable nausea and vomiting 06/09/2016  . Normal vaginal delivery 12/26/2015  . Gestational hypertension 12/24/2015    Past Surgical History:  Procedure Laterality Date  . NO PAST SURGERIES       OB History    Gravida  1   Para  1   Term  1   Preterm  0   AB  0   Living  1     SAB  0   TAB  0   Ectopic  0   Multiple  0   Live Births  1            Home Medications    Prior to Admission medications   Medication Sig Start Date End Date Taking? Authorizing Provider  capsaicin (ZOSTRIX) 0.025 % cream Apply topically 2 (two) times daily. 05/28/18   Arby Barrette, MD  diphenhydrAMINE (BENADRYL) 25 MG tablet Take 1 tablet (25 mg total) by mouth every 6 (six) hours. 05/28/18   Arby Barrette, MD  famotidine (PEPCID) 20 MG tablet Take 1 tablet (20 mg total) by mouth 2 (two) times daily. 05/28/18   Arby Barrette, MD   hydrOXYzine (ATARAX/VISTARIL) 25 MG tablet Take 1 tablet (25 mg total) by mouth every 6 (six) hours. Patient not taking: Reported on 06/09/2016 02/17/16   Demetrios Loll T, PA-C  ibuprofen (ADVIL,MOTRIN) 600 MG tablet Take 1 tablet (600 mg total) by mouth every 6 (six) hours. Patient not taking: Reported on 06/09/2016 12/28/15   Marlow Baars, MD  metoCLOPramide (REGLAN) 10 MG tablet Take 10 mg by mouth every 6 (six) hours as needed for nausea or vomiting.    [provider]  metoCLOPramide (REGLAN) 10 MG tablet Take 1 tablet (10 mg total) by mouth every 6 (six) hours as needed for nausea (nausea/headache). 05/28/18   Arby Barrette, MD  triamcinolone cream (KENALOG) 0.1 % Apply 1 application topically 2 (two) times daily. Patient not taking: Reported on 06/09/2016 02/17/16   Rise Mu, PA-C    Family History Family History  Problem Relation Age of Onset  . Heart disease Paternal Grandfather     Social History Social History   Tobacco Use  . Smoking status: Current Every Day Smoker    Packs/day: 0.25    Types: Cigarettes  . Smokeless tobacco:  Never Used  Substance Use Topics  . Alcohol use: Yes  . Drug use: Yes    Types: Marijuana     Allergies   Patient has no known allergies.   Review of Systems Review of Systems 10 Systems reviewed and are negative for acute change except as noted in the HPI.   Physical Exam Updated Vital Signs BP (!) 156/106   Pulse (!) 53   Temp 98.1 F (36.7 C) (Oral)   Resp (!) 21   Ht  (1.626 m)   Wt 77.1 kg   LMP 05/09/2018 (Approximate)   SpO2 100%   Breastfeeding No   BMI 29.18 kg/m   Physical Exam Constitutional:      Comments: Patient is alert and nontoxic.  Color is good.  No respiratory distress.  She is thrashing about and requesting "help me".  HENT:     Head: Normocephalic and atraumatic.     Nose: Nose normal.     Mouth/Throat:     Mouth: Mucous membranes are moist.     Pharynx: Oropharynx is  clear.  Eyes:     Extraocular Movements: Extraocular movements intact.     Conjunctiva/sclera: Conjunctivae normal.  Cardiovascular:     Rate and Rhythm: Normal rate and regular rhythm.     Pulses: Normal pulses.     Heart sounds: Normal heart sounds.  Pulmonary:     Effort: Pulmonary effort is normal.     Breath sounds: Normal breath sounds.  Abdominal:     Comments: Wound is soft.  Patient endorses epigastric discomfort to palpation.  Lower abdomen nontender.  No guarding.  Musculoskeletal: Normal range of motion.        General: No swelling or tenderness.     Right lower leg: No edema.     Left lower leg: No edema.  Skin:    General: Skin is warm and dry.  Neurological:     General: No focal deficit present.     Mental Status: She is oriented to person, place, and time.     Coordination: Coordination normal.  Psychiatric:     Comments: Patient is very anxious.  She is moaning and writhing about.      ED Treatments / Results  Labs (all labs ordered are listed, but only abnormal results are displayed) Labs Reviewed  COMPREHENSIVE METABOLIC PANEL - Abnormal; Notable for the following components:      Result Value   Glucose, Bld 112 (*)    Calcium 8.6 (*)    ALT 48 (*)    All other components within normal limits  LIPASE, BLOOD  CBC WITH DIFFERENTIAL/PLATELET  URINALYSIS, ROUTINE W REFLEX MICROSCOPIC  RAPID URINE DRUG SCREEN, HOSP PERFORMED  I-STAT BETA HCG BLOOD, ED ([facility], [facility], [facility] ONLY)    EKG None  Radiology No results found.  Procedures Procedures (including critical care time)  Medications Ordered in ED Medications  capsaicin (ZOSTRIX) 0.025 % cream ( Topical Given 05/28/18 1319)  sodium chloride 0.9 % bolus 1,000 mL (0 mLs Intravenous Stopped 05/28/18 1504)  metoCLOPramide (REGLAN) injection 10 mg (10 mg Intravenous Given 05/28/18 1302)  diphenhydrAMINE (BENADRYL) injection 25 mg (25 mg Intravenous Given 05/28/18 1301)  famotidine (PEPCID) IVPB 20 mg premix  (0 mg Intravenous Stopped 05/28/18 1400)  sodium chloride 0.9 % bolus 1,000 mL (1,000 mLs Intravenous New Bag/Given 05/28/18 1505)     Initial Impression / Assessment and Plan / ED Course  I have reviewed the triage vital signs and the nursing  notes.  Pertinent labs & imaging results that were available during my care of the patient were reviewed by me and considered in my medical decision making (see chart for details).  Clinical Course as of May 27 1512  Sat May 28, 2018  1413 Significantly improved.  Patient now resting calmly.  No further dry heaves or writhing about.   [MP]    Clinical Course User Index [MP] Arby Barrette, MD      Patient symptom significantly improved combination of Reglan, Benadryl, Pepcid and capsaicin cream to the abdomen.  Patient had clinically well appearance.  History and appearance highly suggestive of cyclic vomiting likely due to marijuana use.  Patient is instructed on outpatient medications for symptom control and follow-up plan.  Final Clinical Impressions(s) / ED Diagnoses   Final diagnoses:  Hyperemesis  Marijuana use    ED Discharge Orders         Ordered    metoCLOPramide (REGLAN) 10 MG tablet  Every 6 hours PRN     05/28/18 1511    diphenhydrAMINE (BENADRYL) 25 MG tablet  Every 6 hours     05/28/18 1511    capsaicin (ZOSTRIX) 0.025 % cream  2 times daily     05/28/18 1511    famotidine (PEPCID) 20 MG tablet  2 times daily     05/28/18 1511           Arby Barrette, MD 05/28/18 1515

## 2018-05-28 NOTE — ED Notes (Signed)
We have attempted to draw labs x 2. I have notified lab, and one of their phlebotomists will draw her blood shortly.

## 2018-05-28 NOTE — ED Notes (Signed)
She has gone from being distraught to, at present, resting quietly.

## 2019-03-21 IMAGING — US US ABDOMEN LIMITED
1 series · 14 of 25 positions shown · non-contrast
Comparison: None.

CLINICAL DATA: Abdominal pain x 1 day.

EXAM:
US ABDOMEN LIMITED - RIGHT UPPER QUADRANT

[Series 1: us abdomen limited · 0.23mm/px · 14 of 41 slices shown]
[im 1/41]
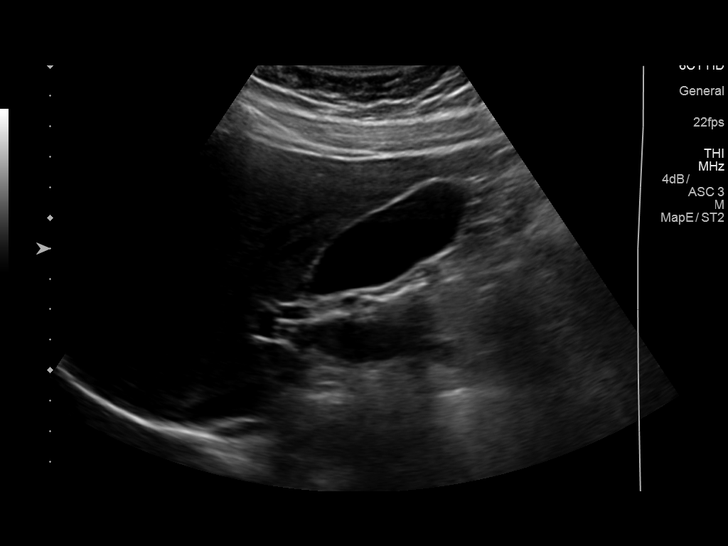
[im 4/41]
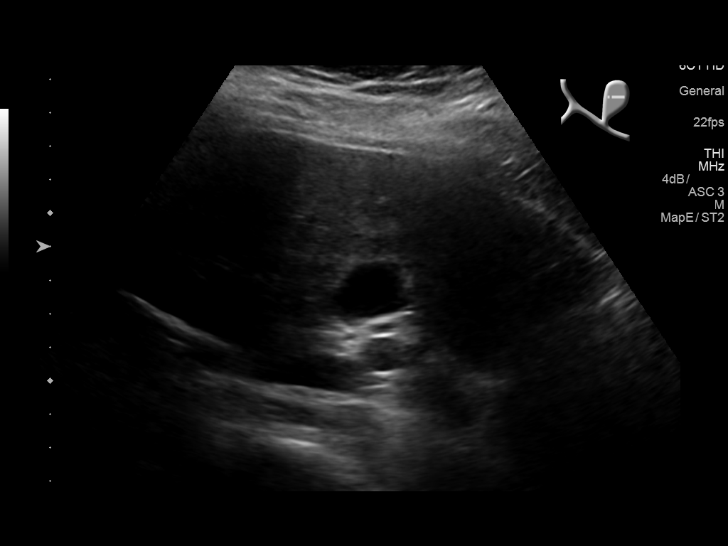
[im 7/41]
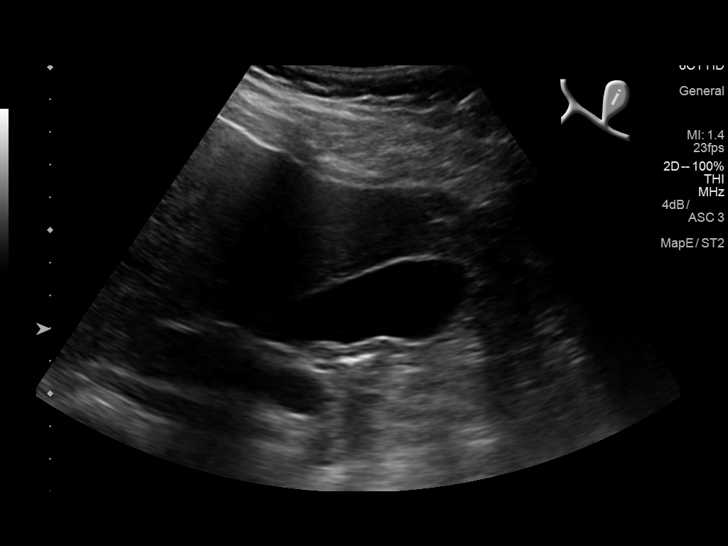
[im 11/41]
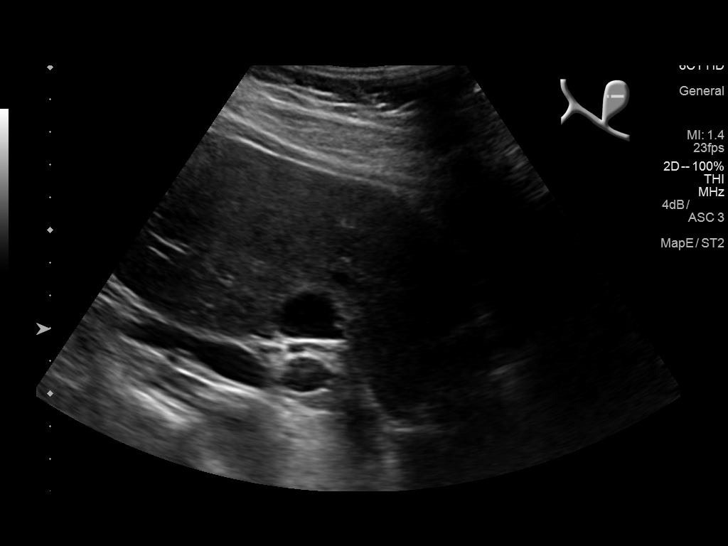
[im 14/41]
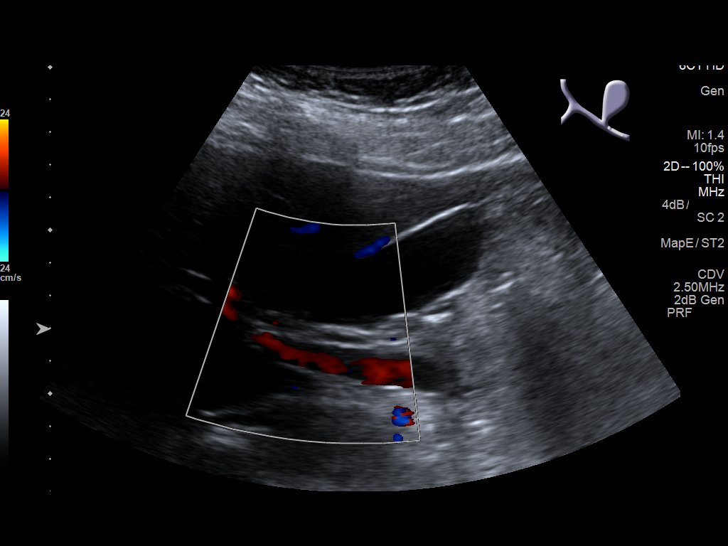
[im 16/41]
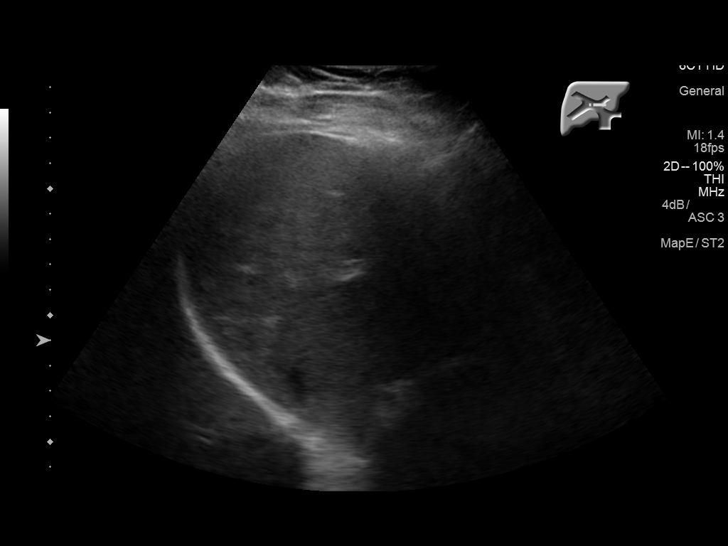
[im 19/41]
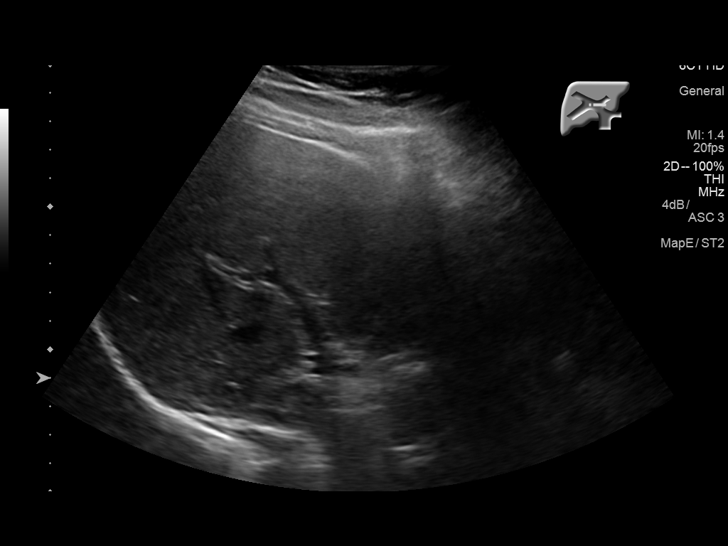
[im 22/41]
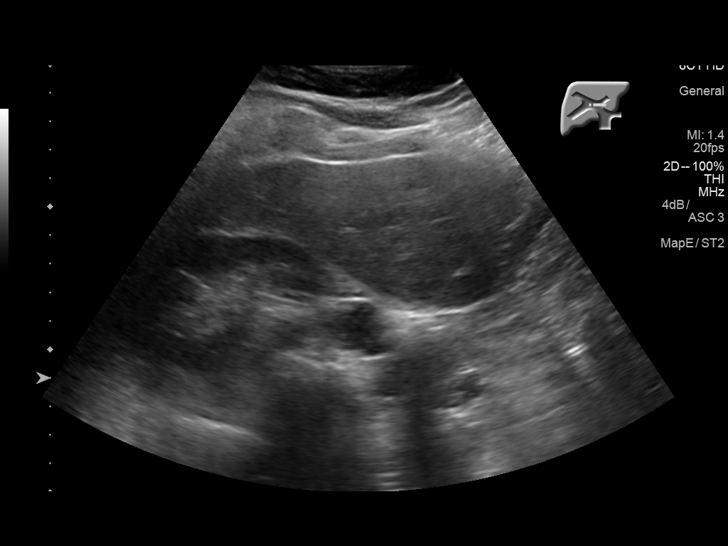
[im 26/41]
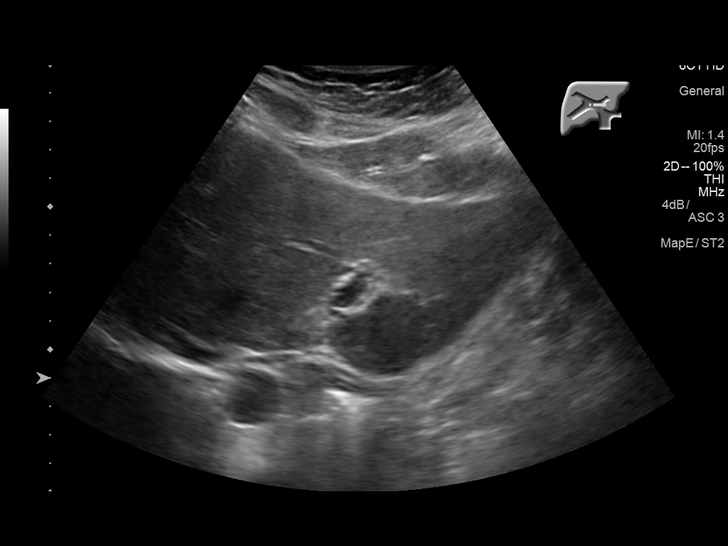
[im 27/41]
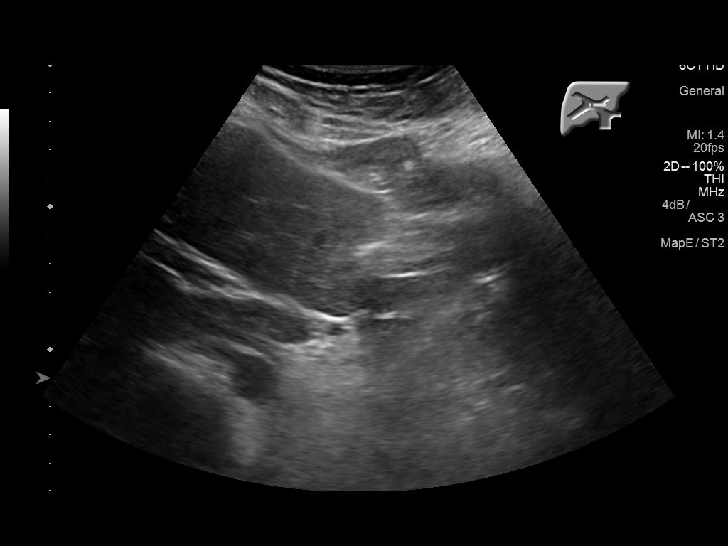
[im 31/41]
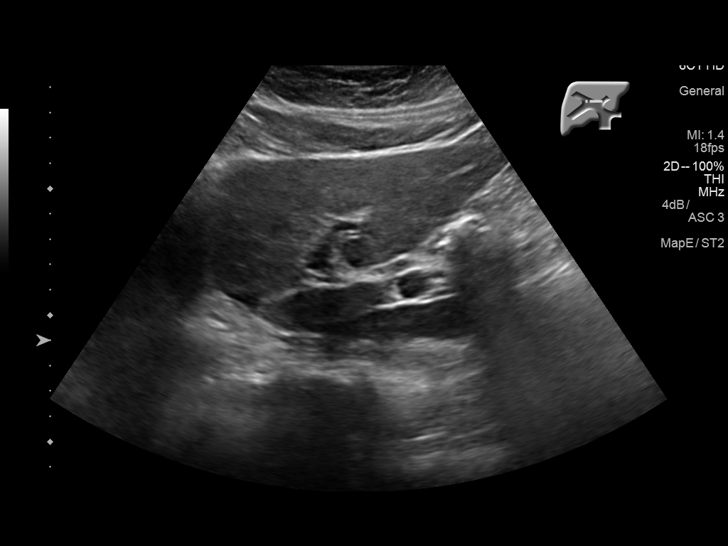
[im 34/41]
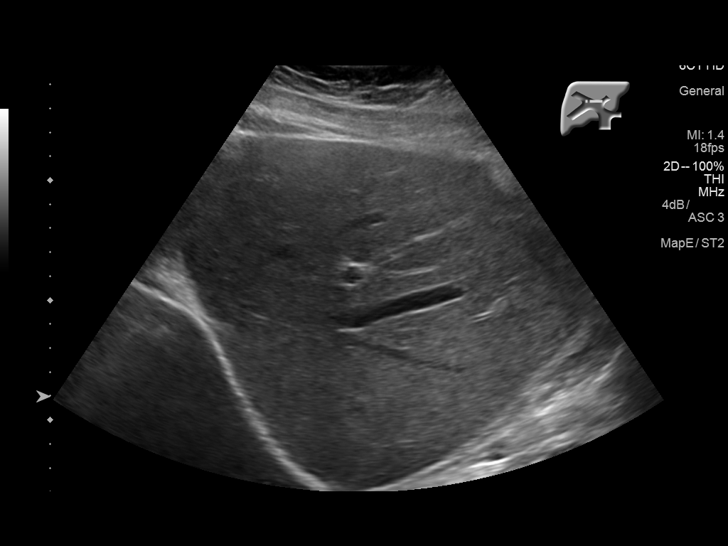
[im 37/41]
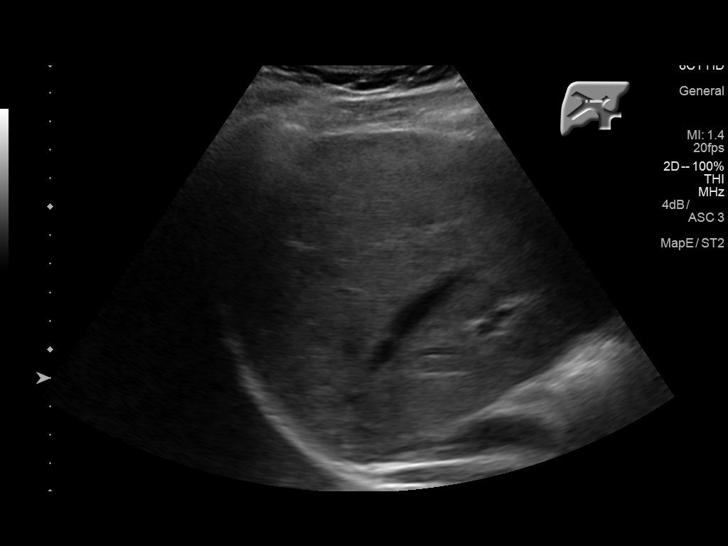
[im 41/41]
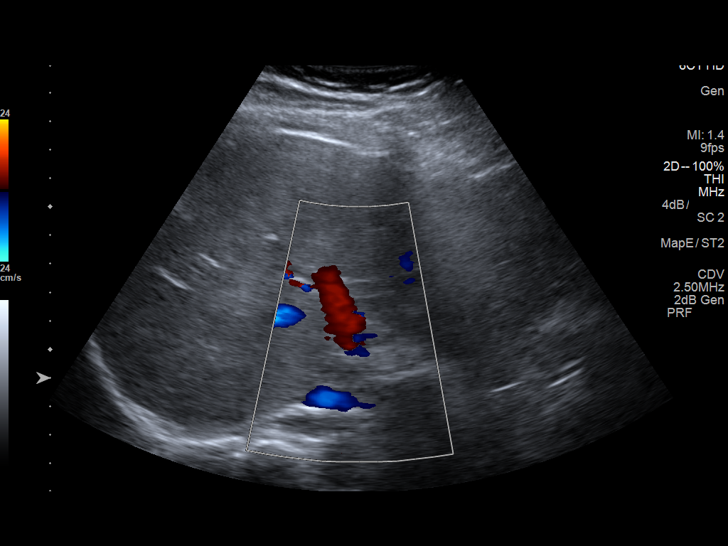

[14 of 25 positions shown; findings below may reference images not displayed]

FINDINGS: Gallbladder:

No gallstones or wall thickening visualized. No sonographic Murphy
sign noted by sonographer.

Common bile duct:

Diameter:  2.6 mm

Liver:

No focal lesion identified. Within normal limits in parenchymal
echogenicity.
IMPRESSION: No acute abnormality. No sonographic findings for the patient's
pain.

## 2019-03-21 IMAGING — US US OB COMP LESS 14 WK
1 series · 14 of 28 positions shown · non-contrast
Comparison: None.

CLINICAL DATA: Abdominal pain x 1 day

EXAM:
OBSTETRIC <14 WK US AND TRANSVAGINAL OB US
TECHNIQUE: Both transabdominal and transvaginal ultrasound examinations were
performed for complete evaluation of the gestation as well as the
maternal uterus, adnexal regions, and pelvic cul-de-sac.
Transvaginal technique was performed to assess early pregnancy.

[Series 1: us ob comp less 14 wk · 0.23mm/px · 14 of 84 slices shown]
[im 4/84]
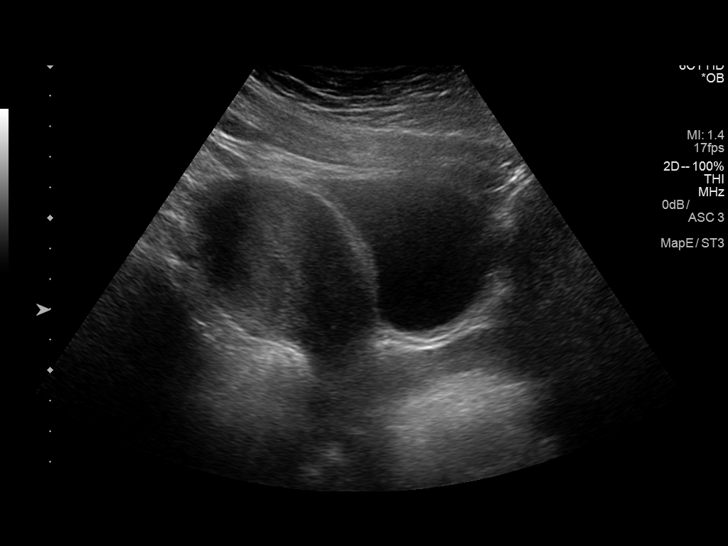
[im 10/84]
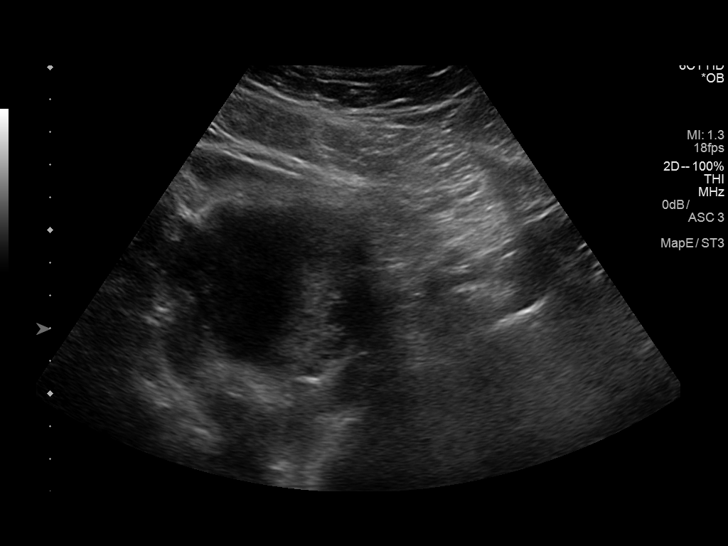
[im 16/84]
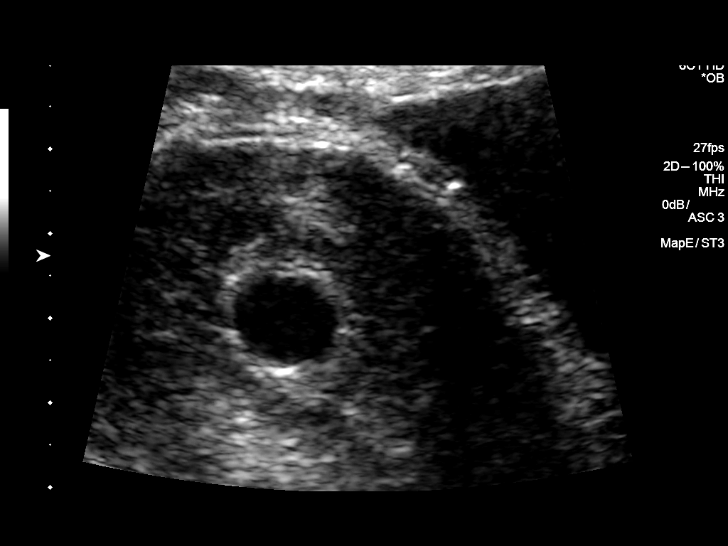
[im 22/84]
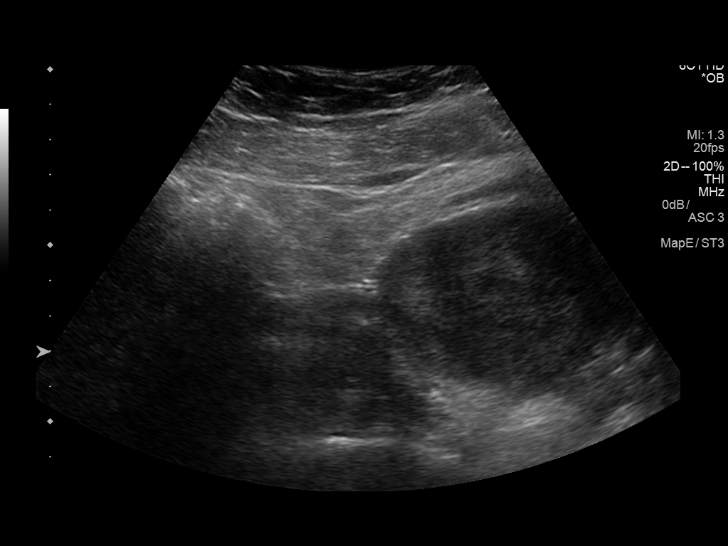
[im 28/84]
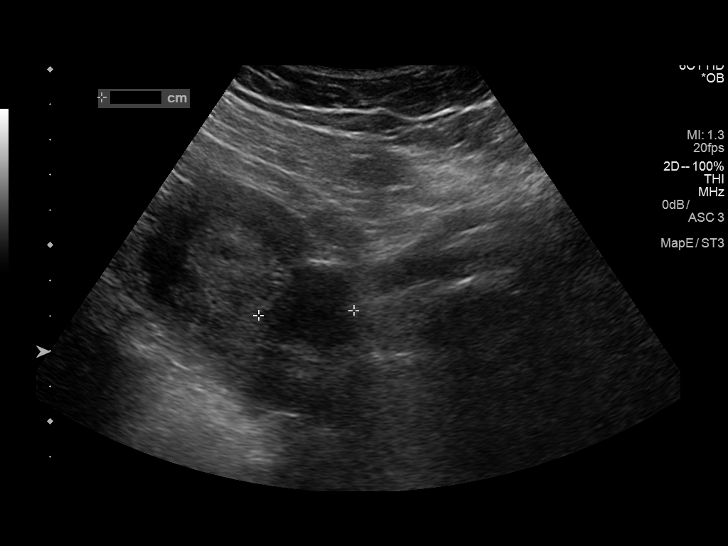
[im 34/84]
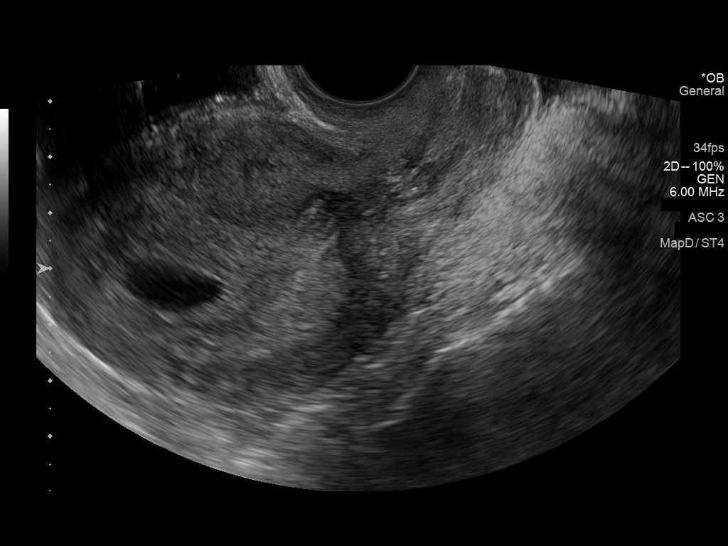
[im 40/84]
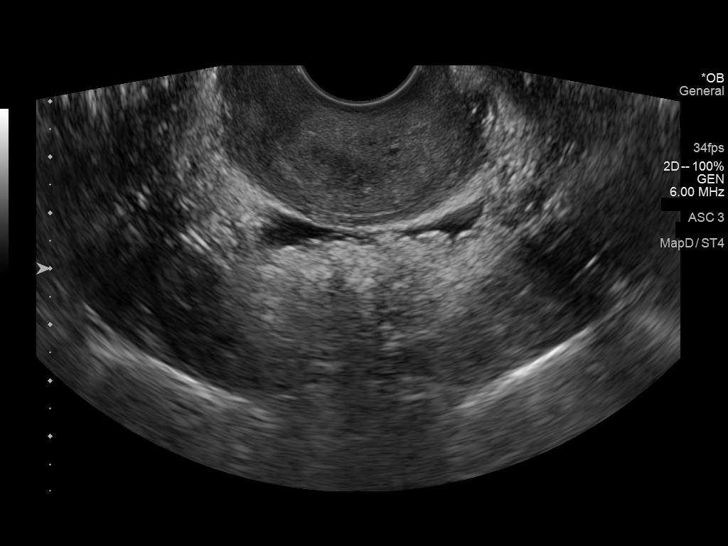
[im 47/84]
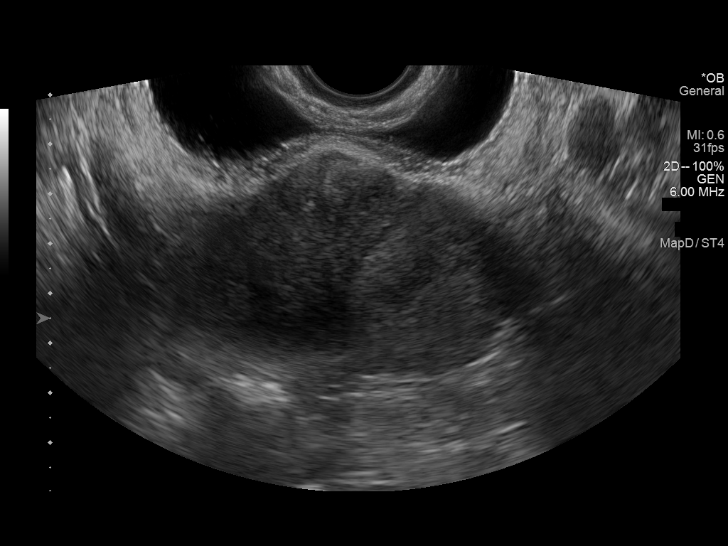
[im 53/84]
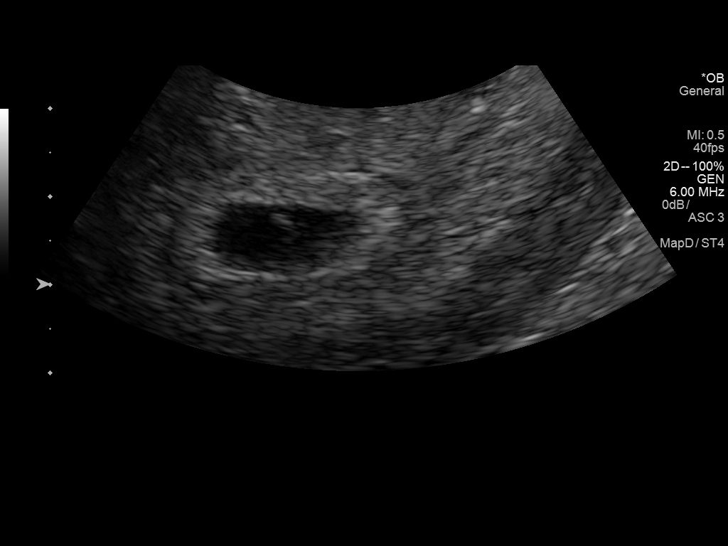
[im 59/84]
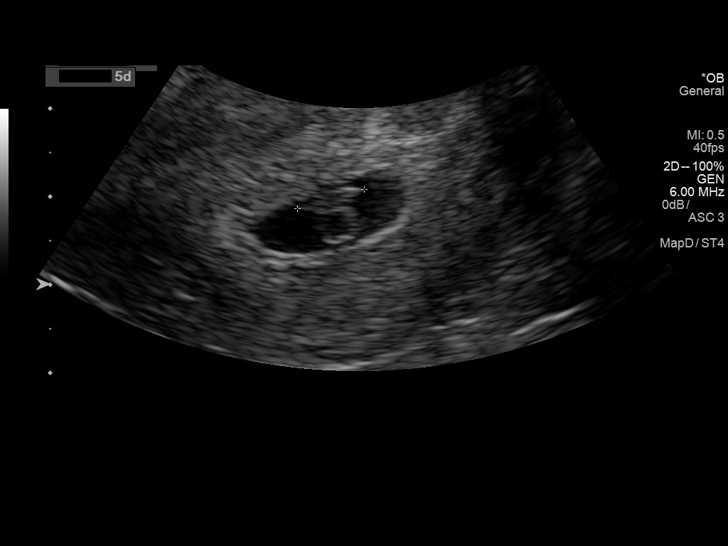
[im 65/84]
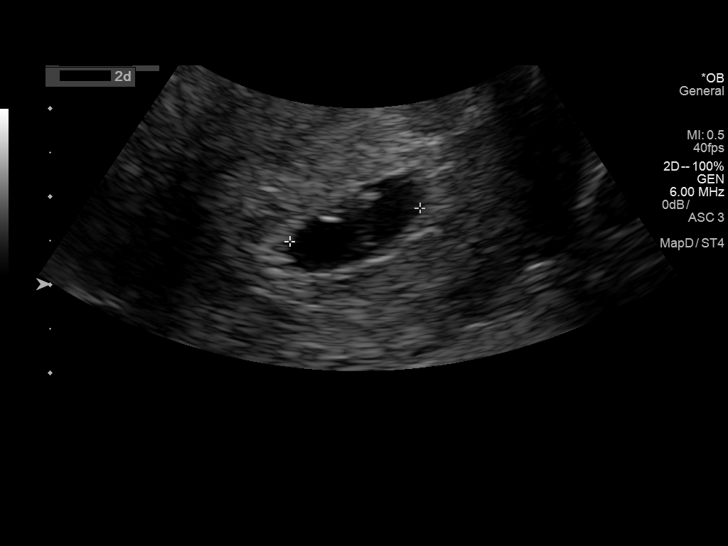
[im 71/84]
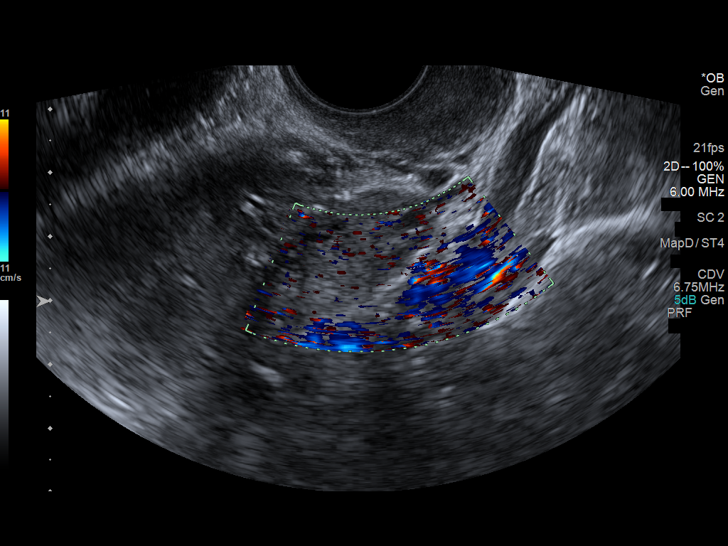
[im 77/84]
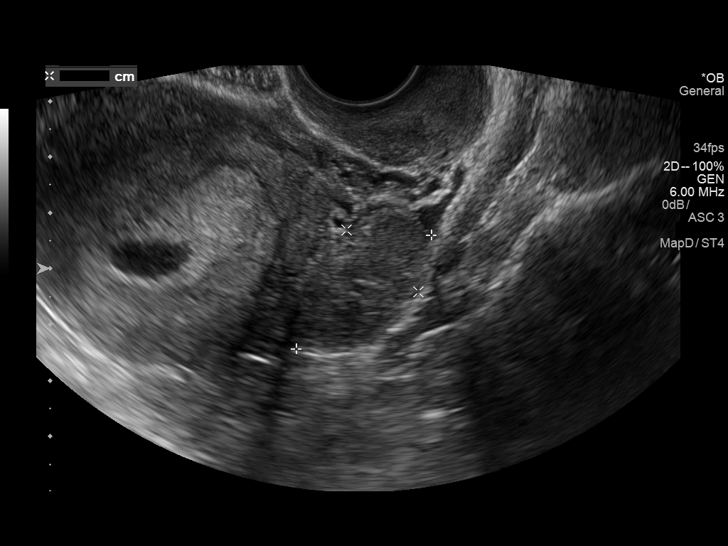
[im 84/84]
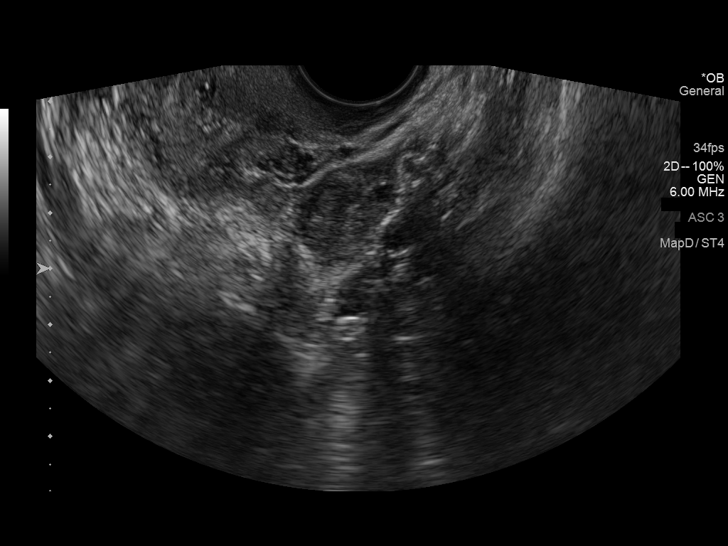

[14 of 28 positions shown; findings below may reference images not displayed]

FINDINGS: Intrauterine gestational sac: Single

Yolk sac:  Visualized.

Embryo:  Visualized.

Cardiac Activity: Visualized.

Heart Rate: 111  bpm

CRL:  7.9  mm   6 w   5 d                  US EDC: 01/29/2017

Subchorionic hemorrhage:  None visualized.

Maternal uterus/adnexae: Normal
IMPRESSION: Single live intrauterine gestation at 6 weeks 5 days, EDC
01/29/2017. No sonographic findings for the abdominal pain.

## 2019-08-11 ENCOUNTER — Emergency Department (HOSPITAL_COMMUNITY)
Admission: EM | Admit: 2019-08-11 | Discharge: 2019-08-11 | Disposition: A | Discharge status: Home / Self Care | Attending: Emergency Medicine | Admitting: Emergency Medicine

## 2019-08-11 ENCOUNTER — Emergency Department (HOSPITAL_COMMUNITY)

## 2019-08-11 ENCOUNTER — Encounter (HOSPITAL_COMMUNITY): Payer: Self-pay

## 2019-08-11 DIAGNOSIS — M25521 Pain in right elbow: Secondary | ICD-10-CM | POA: Diagnosis not present

## 2019-08-11 DIAGNOSIS — F17210 Nicotine dependence, cigarettes, uncomplicated: Secondary | ICD-10-CM | POA: Diagnosis not present

## 2019-08-11 DIAGNOSIS — Y929 Unspecified place or not applicable: Secondary | ICD-10-CM | POA: Diagnosis not present

## 2019-08-11 DIAGNOSIS — Y999 Unspecified external cause status: Secondary | ICD-10-CM | POA: Diagnosis not present

## 2019-08-11 DIAGNOSIS — S0990XA Unspecified injury of head, initial encounter: Secondary | ICD-10-CM | POA: Diagnosis present

## 2019-08-11 DIAGNOSIS — Z79899 Other long term (current) drug therapy: Secondary | ICD-10-CM | POA: Insufficient documentation

## 2019-08-11 DIAGNOSIS — Y939 Activity, unspecified: Secondary | ICD-10-CM | POA: Insufficient documentation

## 2019-08-11 DIAGNOSIS — Z5321 Procedure and treatment not carried out due to patient leaving prior to being seen by health care provider: Secondary | ICD-10-CM | POA: Diagnosis not present

## 2019-08-11 DIAGNOSIS — S0101XA Laceration without foreign body of scalp, initial encounter: Secondary | ICD-10-CM | POA: Diagnosis not present

## 2019-08-11 DIAGNOSIS — R52 Pain, unspecified: Secondary | ICD-10-CM

## 2019-08-11 DIAGNOSIS — M79641 Pain in right hand: Secondary | ICD-10-CM | POA: Insufficient documentation

## 2019-08-11 LAB — RAPID URINE DRUG SCREEN, HOSP PERFORMED
Amphetamines: POSITIVE — AB
Barbiturates: NOT DETECTED
Benzodiazepines: POSITIVE — AB
Cocaine: NOT DETECTED
Opiates: NOT DETECTED
Tetrahydrocannabinol: POSITIVE — AB

## 2019-08-11 LAB — I-STAT CHEM 8, ED
BUN: 8 mg/dL (ref 6–20)
Calcium, Ion: 1.17 mmol/L (ref 1.15–1.40)
Chloride: 102 mmol/L (ref 98–111)
Creatinine, Ser: 0.7 mg/dL (ref 0.44–1.00)
Glucose, Bld: 92 mg/dL (ref 70–99)
HCT: 38 % (ref 36.0–46.0)
Hemoglobin: 12.9 g/dL (ref 12.0–15.0)
Potassium: 3.4 mmol/L — ABNORMAL LOW (ref 3.5–5.1)
Sodium: 140 mmol/L (ref 135–145)
TCO2: 25 mmol/L (ref 22–32)

## 2019-08-11 LAB — I-STAT BETA HCG BLOOD, ED (MC, WL, AP ONLY): I-stat hCG, quantitative: <5 m[IU]/mL (ref <5)

## 2019-08-11 MED ORDER — LIDOCAINE-EPINEPHRINE-TETRACAINE (LET) TOPICAL GEL
3.0000 mL | Freq: Once | TOPICAL | Status: AC
  Administered 2019-08-11: 3 mL via TOPICAL
  Filled 2019-08-11: qty 3

## 2019-08-11 MED ORDER — ONDANSETRON 4 MG PO TBDP
4.0000 mg | ORAL_TABLET | Freq: Once | ORAL | Status: DC
  Filled 2019-08-11: qty 1

## 2019-08-11 MED ORDER — HYDROCODONE-ACETAMINOPHEN 5-325 MG PO TABS
1.0000 | ORAL_TABLET | Freq: Once | ORAL | Status: AC
  Administered 2019-08-11: 1 via ORAL
  Filled 2019-08-11: qty 1

## 2019-08-11 MED ORDER — CYCLOBENZAPRINE HCL 10 MG PO TABS
5.0000 mg | ORAL_TABLET | Freq: Once | ORAL | Status: AC
  Administered 2019-08-11: 5 mg via ORAL
  Filled 2019-08-11: qty 1

## 2019-08-11 NOTE — ED Notes (Signed)
Patient transported to X-ray 
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
## 2019-08-11 NOTE — ED Notes (Signed)
LET at bedside.
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
## 2022-06-15 ENCOUNTER — Other Ambulatory Visit (HOSPITAL_COMMUNITY): Payer: Self-pay | Admitting: Student

## 2022-06-15 DIAGNOSIS — R609 Edema, unspecified: Secondary | ICD-10-CM

## 2022-06-20 ENCOUNTER — Encounter (HOSPITAL_BASED_OUTPATIENT_CLINIC_OR_DEPARTMENT_OTHER): Payer: Self-pay

## 2022-06-20 ENCOUNTER — Ambulatory Visit (HOSPITAL_BASED_OUTPATIENT_CLINIC_OR_DEPARTMENT_OTHER): Admission: RE | Admit: 2022-06-20 | Source: Ambulatory Visit
